# Patient Record
Sex: Female | Born: 1935 | Race: White | Hispanic: No | Marital: Married | Smoking: Never smoker
Health system: Southern US, Community
[De-identification: ages and names within clinical notes are randomized; demographics above are authoritative.]

## PROBLEM LIST (undated history)

## (undated) DIAGNOSIS — K219 Gastro-esophageal reflux disease without esophagitis: Secondary | ICD-10-CM

## (undated) DIAGNOSIS — M1990 Unspecified osteoarthritis, unspecified site: Secondary | ICD-10-CM

## (undated) DIAGNOSIS — H9190 Unspecified hearing loss, unspecified ear: Secondary | ICD-10-CM

## (undated) DIAGNOSIS — I10 Essential (primary) hypertension: Secondary | ICD-10-CM

## (undated) DIAGNOSIS — E785 Hyperlipidemia, unspecified: Secondary | ICD-10-CM

## (undated) DIAGNOSIS — R51 Headache: Secondary | ICD-10-CM

## (undated) DIAGNOSIS — I2510 Atherosclerotic heart disease of native coronary artery without angina pectoris: Secondary | ICD-10-CM

## (undated) HISTORY — DX: Atherosclerotic heart disease of native coronary artery without angina pectoris: I25.10

## (undated) HISTORY — DX: Essential (primary) hypertension: I10

## (undated) HISTORY — DX: Headache: R51

## (undated) HISTORY — DX: Hyperlipidemia, unspecified: E78.5

## (undated) HISTORY — DX: Gastro-esophageal reflux disease without esophagitis: K21.9

## (undated) HISTORY — PX: REPLACEMENT TOTAL KNEE BILATERAL: SUR1225

## (undated) HISTORY — PX: ABDOMINAL HYSTERECTOMY: SHX81

## (undated) HISTORY — PX: CHOLECYSTECTOMY: SHX55

---

## 1999-10-05 ENCOUNTER — Encounter: Admission: RE | Admit: 1999-10-05 | Discharge: 1999-10-05 | Payer: Self-pay | Admitting: Obstetrics and Gynecology

## 1999-10-05 ENCOUNTER — Encounter: Payer: Self-pay | Admitting: Obstetrics and Gynecology

## 2000-10-06 ENCOUNTER — Encounter: Payer: Self-pay | Admitting: Obstetrics and Gynecology

## 2000-10-06 ENCOUNTER — Encounter: Admission: RE | Admit: 2000-10-06 | Discharge: 2000-10-06 | Payer: Self-pay | Admitting: Obstetrics and Gynecology

## 2001-01-19 ENCOUNTER — Inpatient Hospital Stay (HOSPITAL_COMMUNITY): Admission: RE | Admit: 2001-01-19 | Discharge: 2001-01-23 | Payer: Self-pay | Admitting: Orthopaedic Surgery

## 2001-03-11 ENCOUNTER — Encounter: Admission: RE | Admit: 2001-03-11 | Discharge: 2001-04-07 | Payer: Self-pay | Admitting: Orthopaedic Surgery

## 2001-08-05 ENCOUNTER — Inpatient Hospital Stay (HOSPITAL_COMMUNITY): Admission: RE | Admit: 2001-08-05 | Discharge: 2001-08-11 | Payer: Self-pay | Admitting: Orthopaedic Surgery

## 2001-08-11 ENCOUNTER — Encounter: Admission: RE | Admit: 2001-08-11 | Discharge: 2001-08-11 | Payer: Self-pay | Admitting: Family Medicine

## 2001-08-31 ENCOUNTER — Encounter: Admission: RE | Admit: 2001-08-31 | Discharge: 2001-09-28 | Payer: Self-pay | Admitting: Orthopaedic Surgery

## 2002-01-15 ENCOUNTER — Encounter: Admission: RE | Admit: 2002-01-15 | Discharge: 2002-01-15 | Payer: Self-pay | Admitting: Family Medicine

## 2002-01-15 ENCOUNTER — Encounter: Payer: Self-pay | Admitting: Family Medicine

## 2003-01-17 ENCOUNTER — Encounter: Payer: Self-pay | Admitting: Family Medicine

## 2003-01-17 ENCOUNTER — Encounter: Admission: RE | Admit: 2003-01-17 | Discharge: 2003-01-17 | Payer: Self-pay | Admitting: Family Medicine

## 2004-01-24 ENCOUNTER — Ambulatory Visit (HOSPITAL_COMMUNITY): Admission: RE | Admit: 2004-01-24 | Discharge: 2004-01-24 | Payer: Self-pay | Admitting: Family Medicine

## 2004-06-05 ENCOUNTER — Ambulatory Visit: Payer: Self-pay | Admitting: Family Medicine

## 2004-10-16 ENCOUNTER — Ambulatory Visit: Payer: Self-pay | Admitting: Family Medicine

## 2005-02-01 ENCOUNTER — Ambulatory Visit (HOSPITAL_COMMUNITY): Admission: RE | Admit: 2005-02-01 | Discharge: 2005-02-01 | Payer: Self-pay | Admitting: Family Medicine

## 2005-02-19 ENCOUNTER — Ambulatory Visit: Payer: Self-pay | Admitting: Family Medicine

## 2005-04-26 ENCOUNTER — Ambulatory Visit: Payer: Self-pay | Admitting: Family Medicine

## 2005-06-26 ENCOUNTER — Ambulatory Visit: Payer: Self-pay | Admitting: Family Medicine

## 2005-10-03 ENCOUNTER — Ambulatory Visit: Payer: Self-pay | Admitting: Family Medicine

## 2005-10-31 ENCOUNTER — Ambulatory Visit: Payer: Self-pay | Admitting: Family Medicine

## 2006-02-05 ENCOUNTER — Ambulatory Visit (HOSPITAL_COMMUNITY): Admission: RE | Admit: 2006-02-05 | Discharge: 2006-02-05 | Payer: Self-pay | Admitting: Family Medicine

## 2006-03-13 ENCOUNTER — Ambulatory Visit: Payer: Self-pay | Admitting: Family Medicine

## 2006-07-21 ENCOUNTER — Ambulatory Visit: Payer: Self-pay | Admitting: Family Medicine

## 2007-03-11 ENCOUNTER — Ambulatory Visit (HOSPITAL_COMMUNITY): Admission: RE | Admit: 2007-03-11 | Discharge: 2007-03-11 | Payer: Self-pay | Admitting: Family Medicine

## 2008-04-26 ENCOUNTER — Ambulatory Visit (HOSPITAL_COMMUNITY): Admission: RE | Admit: 2008-04-26 | Discharge: 2008-04-26 | Payer: Self-pay | Admitting: Family Medicine

## 2008-05-08 HISTORY — PX: CORONARY ARTERY BYPASS GRAFT: SHX141

## 2008-05-10 ENCOUNTER — Emergency Department (HOSPITAL_COMMUNITY): Admission: EM | Admit: 2008-05-10 | Discharge: 2008-05-10 | Payer: Self-pay | Admitting: Emergency Medicine

## 2008-05-11 HISTORY — PX: CARDIOVASCULAR STRESS TEST: SHX262

## 2008-05-12 HISTORY — PX: US ECHOCARDIOGRAPHY: HXRAD669

## 2008-05-16 ENCOUNTER — Inpatient Hospital Stay (HOSPITAL_BASED_OUTPATIENT_CLINIC_OR_DEPARTMENT_OTHER): Admission: RE | Admit: 2008-05-16 | Discharge: 2008-05-16 | Payer: Self-pay | Admitting: Cardiology

## 2008-05-16 HISTORY — PX: CARDIAC CATHETERIZATION: SHX172

## 2008-05-17 ENCOUNTER — Ambulatory Visit: Payer: Self-pay | Admitting: Cardiothoracic Surgery

## 2008-05-18 ENCOUNTER — Encounter: Payer: Self-pay | Admitting: Cardiothoracic Surgery

## 2008-05-18 ENCOUNTER — Ambulatory Visit (HOSPITAL_COMMUNITY): Admission: RE | Admit: 2008-05-18 | Discharge: 2008-05-18 | Payer: Self-pay | Admitting: Cardiothoracic Surgery

## 2008-05-20 ENCOUNTER — Inpatient Hospital Stay (HOSPITAL_COMMUNITY): Admission: RE | Admit: 2008-05-20 | Discharge: 2008-05-25 | Payer: Self-pay | Admitting: Cardiothoracic Surgery

## 2008-05-20 ENCOUNTER — Ambulatory Visit: Payer: Self-pay | Admitting: Cardiothoracic Surgery

## 2008-06-23 ENCOUNTER — Ambulatory Visit: Payer: Self-pay | Admitting: Cardiothoracic Surgery

## 2010-04-26 ENCOUNTER — Ambulatory Visit: Payer: Self-pay | Admitting: Cardiology

## 2010-07-29 ENCOUNTER — Encounter: Payer: Self-pay | Admitting: Cardiothoracic Surgery

## 2010-09-24 ENCOUNTER — Other Ambulatory Visit (HOSPITAL_COMMUNITY): Payer: Self-pay | Admitting: Family Medicine

## 2010-09-24 DIAGNOSIS — Z139 Encounter for screening, unspecified: Secondary | ICD-10-CM

## 2010-10-25 ENCOUNTER — Ambulatory Visit (HOSPITAL_COMMUNITY): Payer: Self-pay

## 2010-10-30 ENCOUNTER — Ambulatory Visit (HOSPITAL_COMMUNITY)
Admission: RE | Admit: 2010-10-30 | Discharge: 2010-10-30 | Disposition: A | Discharge status: Home / Self Care | Payer: Medicare Other | Source: Ambulatory Visit | Attending: Family Medicine | Admitting: Family Medicine

## 2010-10-30 DIAGNOSIS — Z1231 Encounter for screening mammogram for malignant neoplasm of breast: Secondary | ICD-10-CM | POA: Insufficient documentation

## 2010-10-30 DIAGNOSIS — Z139 Encounter for screening, unspecified: Secondary | ICD-10-CM

## 2010-11-20 NOTE — Discharge Summary (Signed)
NAMESHAWNTELL, DIXSON NO.:  [id number]   MEDICAL RECORD NO.:  [id number]          PATIENT TYPE:  INP   LOCATION:  2016                         FACILITY:  [facility]   PHYSICIAN:  Sheliah Plane, MD    DATE OF BIRTH:  Dec 05, 1923   DATE OF ADMISSION:  05/20/2008  DATE OF DISCHARGE:  05/25/2008                               DISCHARGE SUMMARY   HISTORY:  The patient is an 77 year old female who is noted over the  past couple of months several episodes of heart racing associated with  substernal chest discomfort with some radiation and aching into her left  arm.  Due to the progressive nature of her symptoms the patient was  referred to Dr. Roger Shelter.  A Cardiolite stress test was done and  this revealed preserved left ventricular function with evidence of  anterior ischemia.  An echocardiogram showed moderate left ventricular  hypertrophy with impaired left ventricular relaxation.  Normal systolic  function, mild aortic sclerosis, mild mitral regurgitation, and mild  tricuspid regurgitation.  Due to these findings, a cardiac  catheterization was done and this revealed severe 3-vessel coronary  artery disease.  Due to this, she was referred to Dr. Sheliah Plane  for consideration of surgical revascularization.  Dr. Tyrone Sage evaluated  the patient and her studies and agreed with these recommendations and  she was admitted this hospitalization for the procedure.   PAST MEDICAL HISTORY:  1. Hypertension.  2. Hyperlipidemia.   PAST SURGICAL HISTORY:  Bilateral knee replacement, cholecystectomy, and  hysterectomy.   MEDICATIONS PRIOR TO ADMISSION:  1. Zocor 20 mg daily.  2. Aspirin 81 mg daily.  3. Avalide 150/12.5 mg once daily.  4. Fish oil 1 daily.  5. Toprol 25 mg daily.   FAMILY HISTORY:  Please see the history and physical done at the time of  admission.   SOCIAL HISTORY:  Please see the history and physical done at the time of  admission.   REVIEW  OF SYMPTOMS:  Please see the history and physical done at the  time of admission.   PHYSICAL EXAMINATION:  Please see the history and physical done at the  time of admission.   HOSPITAL COURSE:  The patient was admitted electively and on May 20, 2008, she was taken to the operating room where she underwent the  following procedure, coronary artery bypass grafting x4.  The following  grafts were placed:  1. Left internal mammary artery to the LAD.  2. A saphenous vein graft to the obtuse marginal.  3. A saphenous vein graft to the right coronary artery.  4. A saphenous vein graft to the diagonal.   The patient tolerated the procedure well and was taken to the surgical  intensive care unit in stable condition.   POSTOPERATIVE HOSPITAL COURSE:  The patient has done quite well.  She  was extubated without difficulty.  She is neurologically intact.  She  remained hemodynamically stable initially requiring some minor inotropic  support; however, these was weaned without difficulty.  She does have a  acute blood loss anemia, which has stabilized.  Her most  recent  hemoglobin and hematocrit dated on May 25, 2008, are 9.6 and 28.2  respectively.  Electrolytes, BUN, and creatinine are within normal  limits.  Her incisions are healing without evidence of infection;  however, she does have some blistering in the right knee endoscopic vein  harvest site from Steri-Strips.  These are being treated with wound care  including Silvadene.  She did have a postoperative episode of atrial  fibrillation and several short bursts of supraventricular tachycardia.  These have been stabilized with beta-blockade.  Oxygen has been weaned  and she maintains good saturations on room air.  She has tolerated a  routine advancement activity using standard protocols.  Overall her  status is felt to be quite stable for discharge on today's date May 25, 2008.   MEDICATIONS ON DISCHARGE:  1. Zocor 20  mg daily.  2. Aspirin 81 mg daily.  3. Avalide 150/12.5 mg once daily.  4. Fish oil once daily.  5. Toprol-XL 50 mg once daily.  6. For pain Ultram 50 mg 1 every 6 hours p.r.n.   ADDITIONAL MEDICATIONS:  Silvadene to the right knee blister 3 times  daily.   INSTRUCTIONS:  The patient will receive written instructions in regard  to medications, activity, diet, wound care, and followup.   FOLLOWUP:  Dr. Tyrone Sage on June 23, 2008, at 12:30.  Additional, she  will see Conni Elliot, nurse practitioner, in 2 weeks at Dr. Ronnald Nian  office.   FINAL DIAGNOSIS:  Severe 3-vessel coronary artery disease now status  post surgical revascularization as described.   OTHER DIAGNOSES:  1. Hypertension.  2. Hypercholesterolemia.  3. Acute blood loss anemia.  4. Postoperative supraventricular tachycardia.  5. Brief episode of atrial fibrillation now, normal sinus rhythm.  6. Postoperative thrombocytopenia resolved.      Rowe Clack, P.A.-C.      Sheliah Plane, MD  Electronically Signed    WEG/MEDQ  D:  05/25/2008  T:  05/25/2008  Job:  [id number]   cc:   Sheliah Plane, MD  Delaney Meigs, M.D.  Colleen Can. Deborah Chalk, M.D.

## 2010-11-20 NOTE — Assessment & Plan Note (Signed)
OFFICE VISIT   Laura Woodard, Laura Woodard  DOB:  08/01/1923                                        June 23, 2008  CHART #:  [id number]   The patient returns to the office today in followup after coronary  artery bypass grafting for unstable angina on 05/20/2008.  At that time,  she had coronary artery bypass grafting x4 with the left internal  mammary to the LAD, vein graft to the diagonal, vein graft to the first  obtuse marginal, and vein graft to the distal right coronary artery with  right thigh and calf endovein harvesting.  Overall, the patient has done  extremely well especially considering her age of 67 years.  She is  running her own cardiac rehab program at home.  She was not interested  in a formal rehab, though appears to be returning rapidly to her usual  activities without recurrent angina.  She recently stopped working and  decided she would not return to working part-time job.  She did note  early after discharge home some lightheadedness, and her Avalide was  stopped for low blood pressure.   PHYSICAL EXAMINATION:  VITAL SIGNS:  Her blood pressure 170/86, pulse is  60, respiratory rate 18, and O2 sats 99%.  CHEST:  Her sternum is stable and well healed.  LUNGS:  Clear bilaterally.  CARDIAC:  Regular rate and rhythm without murmur or gallop.  The vein  harvest site is healing well.  EXTREMITIES:  She has no pedal edema.   Followup chest x-ray done at Dr. Ronnald Nian office last week shows clear  lung fields without evidence of effusions bilaterally.   She continues on Toprol 25 b.i.d., Zocor 20 daily, and aspirin 81 mg a  day; currently holding her Avalide 150/12.5.   Overall, I am very pleased with her progress.  She seems very anxious to  return to further activities.  I have allowed her return to driving and  cautioned about any heavy lifting.  I have not made a return appointment  to see me but would be glad to see her at anytime,  should it be  necessary.   Sheliah Plane, MD  Electronically Signed   EG/MEDQ  D:  06/23/2008  T:  06/23/2008  Job:  [id number]   cc:   Colleen Can. Deborah Chalk, M.D.  Delaney Meigs, M.D.

## 2010-11-20 NOTE — H&P (Signed)
NAMELYRIC, Laura NO.:  [id number]   MEDICAL RECORD NO.:  [id number]          PATIENT TYPE:  OIB   LOCATION:  NA                           FACILITY:  [facility]   PHYSICIAN:  Colleen Can. Deborah Chalk, M.D.DATE OF BIRTH:  [birth year]   DATE OF ADMISSION:  DATE OF DISCHARGE:                              HISTORY & PHYSICAL   CHIEF COMPLAINT:  Chest pain.   HISTORY OF PRESENT ILLNESS:  The patient is an 77 year old white female  who is referred for diagnostic cardiac catheterization.  She has had  spells of heart racing here over the last couple of months.  She has  also noted some discomfort in her left arm as well as hurting across the  shoulders.  She had an episode this Sunday 1 week ago while washing  dishes. She had significant discomfort across her shoulders and then  developed a rapid heart rate.  She was referred for Cardiolite study  which was performed on November 4 that showed anterior ischemia.  Her  ejection fraction was normal at 61%.  She is now referred for diagnostic  cardiac catheterization.   PAST MEDICAL HISTORY:  1. Bilateral knee replacements  2. Remote cholecystectomy.  3. Remote hysterectomy.  4. Hyperlipidemia.  5. Hypertension.  6. Positive family history of coronary disease.   ALLERGIES:  None.   CURRENT MEDICATIONS:  Zocor 20 mg a day, baby aspirin a day. Avalide,  that dose is currently unknown. Fish oil daily, multivitamin daily,  joint pain medicine daily and Toprol XL 25 mg a day.   FAMILY HISTORY:  Father died in his 85s with arthritis and had a heart  attack in his 15s as well as hypertension.  He was subsequently killed  in a motor vehicle accident.  Her mother died at age 64 of lung cancer  and had hypertension.  She has two brothers and five sisters. A brother  and a sister have had stents placed as well as previous heart attack.   SOCIAL HISTORY:  She is married. Her husband has progressive dementia.  She has never smoked and  does not use alcohol.  She has two children.   REVIEW OF SYSTEMS:  As noted above and is otherwise unremarkable.   PHYSICAL EXAMINATION:  GENERAL APPEARANCE: She is a pleasant white  female.  She is in no acute distress.  VITAL SIGNS:  Her weight 125-1/2 pounds, blood pressure 170/80 sitting,  162/80 standing. Heart rate 80, respirations 18.  She is afebrile.  SKIN:  Warm and dry.  Color is unremarkable.  LUNGS:  Clear.  HEART: Shows a regular rhythm.  ABDOMEN: Soft with positive bowel sounds, nontender.  EXTREMITIES: Without edema.  NEUROLOGIC:  Shows no gross focal deficits.   Pertinent labs are pending.   OVERALL IMPRESSION:  1. Chest pain.  2. Abnormal Cardiolite study.  3. Hypertension.  4. History of palpitations with rapid heart rate  5. Positive family history of coronary disease.   PLAN:  We will proceed on with diagnostic cardiac catheterization.  The  risks of the procedure and benefits have all been explained and she  is  willing to proceed on Monday, May 16, 2008.      Sharlee Blew, N.P.      Colleen Can. Deborah Chalk, M.D.  Electronically Signed    LC/MEDQ  D:  05/16/2008  T:  05/16/2008  Job:  [id number]

## 2010-11-20 NOTE — Cardiovascular Report (Signed)
NAMENYIAH, PIANKA NO.:  [id number]   MEDICAL RECORD NO.:  [id number]          PATIENT TYPE:  OIB   LOCATION:  1965                         FACILITY:  [facility]   PHYSICIAN:  Colleen Can. Deborah Chalk, M.D.DATE OF BIRTH:  [birth year]   DATE OF PROCEDURE:  05/16/2008  DATE OF DISCHARGE:  05/16/2008                            CARDIAC CATHETERIZATION   Ms. Bellavance presents with recurrent chest pain.  She has a history of  tachycardia.  She had a stress Cardiolite study, which showed anterior  ischemia.  She had an ejection fraction of 61% at that time.   PROCEDURE:  Left heart catheterization with selective coronary  angiography and left ventricular angiography.   TYPE AND SITE OF ENTRY:  Percutaneous right femoral artery.   CATHETERS:  4-French 4 curved Judkins left coronary catheter, 4-French 3-  DRC right coronary catheter, 4-French pigtail ventriculographic  catheter.   CONTRAST MATERIAL:  Omnipaque.   MEDICATIONS GIVEN PRIOR TO PROCEDURE:  Valium 10 mg p.o.   MEDICATIONS GIVEN DURING THE PROCEDURE:  Nitroglycerin sublingual and  labetalol 10 mg IV.   COMMENT:  The patient tolerated the procedure well.   HEMODYNAMIC DATA:  The aortic pressure was 149/67, LV was 162/11-17.  There is no aortic valve gradient noted on pullback.   ANGIOGRAPHIC DATA:  1. Left main coronary artery is normal.  2. Left anterior descending:  The left anterior descending has      approximately 1-cm of normal vessel and then has severe segmental      disease for the next 3-4 cm.  Rising out of this area severe      stenosis, it is a reasonably large first diagonal vessel and the      next smaller second diagonal vessel.  These vessels arise      approximately 1 cm apart from on the left anterior descending.      There is a calcified severe disease involving the ostium of these      diagonals and would estimated to be at least a 95% stenosis at its      highest point.  The second  diagonal vessel is approximately 1 mm in      diameter and first diagonal was proximally 1.5-2 mm in diameter.      It is felt that the left anterior descending at the first diagonal      would be bypassable.  There is satisfactory flow in the distal left      anterior descending and 1 small irregular plaque of approximately      50% focally and distally.  3. Left circumflex left.  The left circumflex is a moderate large      vessel and gives off a posterior lateral branch.  It has a large      obtuse marginal and extends to the apex.  It had irregularities,      but no significant focal stenosis.  4. Right coronary artery.  The right coronary artery is a moderate-      sized dominant vessel.  It has sequential stenosis, proximally      involving ostium  of the vessel.  There were a 4 separate stenoses      each in the 80-90% narrowed range.  Distal vessel would be      bypassable.  5. Left ventricular angiogram was performed in the RAO position.      Overall cardiac size and silhouette are normal.  The global      ejection fraction is estimated at 70+ percent range.  There is no      mitral regurgitation, intracardiac calcification, or intracavitary      filling defect.   OVERALL IMPRESSION:  1. Normal left ventricular function.  2. Severe two-vessel coronary atherosclerosis involving bifurcations      as well as diagonal branches of the left anterior descending.   DISCUSSION:  In light of these findings, it is felt that Ms. Scholle  will need to be referred for coronary artery bypass grafting.      Colleen Can. Deborah Chalk, M.D.  Electronically Signed     SNT/MEDQ  D:  05/16/2008  T:  05/17/2008  Job:  [id number]   cc:   Delaney Meigs, M.D.

## 2010-11-20 NOTE — H&P (Signed)
NAMEMCKENZEY, Laura Woodard NO.:  [id number]   MEDICAL RECORD NO.:  [id number]          PATIENT TYPE:  OIB   LOCATION:  1965                         FACILITY:  [facility]   PHYSICIAN:  Sheliah Plane, MD    DATE OF BIRTH:  1923/07/18   DATE OF ADMISSION:  05/17/2008  DATE OF DISCHARGE:                              HISTORY & PHYSICAL   FOLLOWUP CARDIOLOGIST:  Colleen Can. Deborah Chalk, M.D.   PRIMARY CARE PHYSICIAN:  Delaney Meigs, M.D.   REASON FOR CONSULTATION:  Severe three-vessel coronary artery disease.   HISTORY OF PRESENT ILLNESS:  The patient is an 77 year old female who is  noted over the past couple of months several episodes of heart racing  associated with substernal discomfort with some aching radiating into  her left arm.  Because of the progressive nature of the symptoms, the  patient was referred to Dr. Deborah Chalk.  Cardiolite stress test was done  showing preserved LV function with evidence of anterior ischemia.  An  echocardiogram showed moderate LVH, impaired LV relaxation, normal  systolic function, mild aortic sclerosis, mild mitral regurgitation, and  mild tricuspid regurgitation.  Because of the positive stress test and  the progressive symptoms of angina, the patient underwent cardiac  catheterization, demonstrating severe three-vessel coronary artery  disease and she was referred for discussion of risks and options with  coronary artery bypass grafting with treatment recommended by  Cardiology.   PAST MEDICAL HISTORY:  The patient denies any previous myocardial  infarction, angioplasty, or cardiac surgery.  She has a history of  hypertensive hyperlipidemia.  Denies diabetes.  She is a nonsmoker.  She  does have the family history of coronary disease.  She has had no  previous stroke, no claudication, and no renal insufficiency.   Past medical problems include bilateral knee replacements,  cholecystectomy, and hysterectomy.   SOCIAL AND FAMILY  HISTORY:  The patient just retired from working in  [employer] job about 2 weeks ago.  Her husband has known dementia and she  spends a significant amount of time cared for him.  She has remained  very physically active.  No alcohol use.  Family history is positive  coronary disease.  The patient's father had a myocardial infarction at  age 21, died of a motor vehicle accident 7 or 8 years later.  Mother had  no heart trouble, died of lung cancer.  She has 2 sons, both who have  known coronary disease.  One had a myocardial infarction at age 11, and  subsequently had a AICD placed, other son has cardiac stents.   CURRENT MEDICATIONS:  1. Toprol 25 mg b.i.d.  2. Zocor 20 mg a day.  3. Aspirin 81 mg a day.  4. Avalide 150/12.5.  5. Fish oil.  I have asked her to discontinue her fish oil prior to      surgery.   ALLERGIES:  None known.   REVIEW OF SYSTEMS:  Positive for chest pain, palpitations, and  exertional shortness of breath.  She has had several months ago an  episode of blacking out in the bathroom at night,  but no other episodes.  Denies weight gain or loss, fever or chills.  Denies amaurosis or TIAs.  Denies wheezing or hemoptysis.  She has had some mild exertional  shortness of breath.  Denies change in bowel habits.  Denies any blood  in her urine or stool.  MUSCULOSKELETAL:  With the bilateral knee  replacements, she has been ambulatory without difficulty.  Denies  polyuria or polydipsia.  Other review of systems are negative.   PHYSICAL EXAMINATION:  VITAL SIGNS:  Her blood pressure is 198/89, pulse  is 65, respiratory rate is 18, and O2 sats 98%.  GENERAL:  The patient is awake, alert, and neurologically intact.  She  appears younger than her stated age and able to provide her history in  good detail.  NECK:  She has no carotid bruits.  LUNGS:  Clear bilaterally.  CARDIAC:  Regular rhythm without murmur or gallop.  ABDOMINAL:  Benign without palpable masses.  Right groin  site and cath  sites without significant hematoma.  EXTREMITIES:  She does have palpable distal pulses.  Has superficial  varicosities in both legs, spiral vein type.  Appears to have adequate  vein at both ankles, +1 DP and PT pulses bilaterally.   Cardiac catheterization films were reviewed.  The patient has a greater  90% complex of the calcified proximal LAD lesion involving diagonal.  Additionally, she has significant circumflex disease with several small  obtuse marginal vessels.  The right coronary artery also is a codominant  vessel with sequential 90% lesions.  Overall, ventricular function  preserved.   IMPRESSION:  Active 77 year old female with positive stress test and  symptoms of progressive angina with severe three-vessel coronary artery  disease without any superior comorbidities.  I agree with Dr. Deborah Chalk  recommendation of coronary artery bypass grafting.  I have discussed  with the patient and her family including her 2 sons and sisters in  detail, the risks and options involved.  The risks of death, infection,  stroke, myocardial infarction, bleeding, and blood transfusion, all  discussed in detail and the patient has had her questions answered and  is willing to proceed with surgery.   PLAN:  To proceed on May 20, 2008, obtaining venous mapping and  carotid Doppler studies, and preoperative lab on the May 18, 2008.      Sheliah Plane, MD  Electronically Signed     EG/MEDQ  D:  05/17/2008  T:  05/18/2008  Job:  [id number]   cc:   Delaney Meigs, M.D.  Colleen Can. Deborah Chalk, M.D.

## 2010-11-20 NOTE — Op Note (Signed)
NAMEANTONINA, Woodard NO.:  [id number]   MEDICAL RECORD NO.:  [id number]          PATIENT TYPE:  INP   LOCATION:  2016                         FACILITY:  [facility]   PHYSICIAN:  Sheliah Plane, MD    DATE OF BIRTH:  1924-05-10   DATE OF PROCEDURE:  05/20/2008  DATE OF DISCHARGE:                               OPERATIVE REPORT   PREOPERATIVE DIAGNOSIS:  Unstable angina with severe coronary occlusive  disease.   POSTOPERATIVE DIAGNOSIS:  Unstable angina with severe coronary occlusive  disease.   SURGICAL PROCEDURES:  1. Coronary artery bypass grafting x4 with left internal mammary to      the left anterior descending coronary artery.  2. Reverse saphenous vein graft to the diagonal coronary artery.  3. Reverse saphenous vein graft to the first obtuse marginal.  4. Reverse saphenous vein graft to the distal right coronary artery.  5. Right thigh and calf endovein harvesting.   SURGEON:  Sheliah Plane, MD.   FIRST ASSISTANT:  Rowe Clack, P.A.-C.   BRIEF HISTORY:  The patient is an 77 year old female who began having  increasing symptoms of exertional symptoms and because of her increasing  chest discomfort and shortness of breath, she saw Dr. Deborah Woodard who did a  stress test which was positive for anterior ischemia.  Cardiac  catheterization showed complex proximal LAD disease greater than 90%  involving diagonal.  There was 60% obstruction on the circumflex  involving a larger first obtuse marginal and smaller distal circ  branches.  The right coronary artery was of moderate size with  sequential 80-90% lesions in the proximal half the vessel.  Overall  ventricular function was preserved because of the patient's critical  anatomy, not suitable for angioplasty.  Coronary artery bypass grafting  was recommended.  The patient agreed and signed informed consent.   DESCRIPTION OF PROCEDURE:  With Swan-Ganz and arterial line monitors in  place, the patient  underwent general endotracheal anesthesia without  incidence given the chest and legs was prepped with Betadine and draped  in the usual sterile manner.  Using the [employer] endovein harvesting  system, vein was harvested from the right calf.  A median sternotomy was  performed.  Left internal mammary artery was dissected down as pedicle  graft.  This artery was divided and had good free flow.  Her pericardium  was opened.  Overall ventricular function appeared preserved.  The  patient did have significant calcification at the takeoff of the  innominate artery.  Cannulation was accomplished in area just proximal  to this without difficulty.  The right atrial cannula was placed.  The  patient had been systemically heparinized prior to an initiation of  endovein harvesting, 2500 units of the heparin had been given.  The  patient was placed on cardiopulmonary bypass at 2.4 liters per minute  per meter squared.  Sites of anastomosis were selected and dissected out  of the epicardium.  The patient's body temperature was cooled to 30  degrees.  The aortic crossclamp was applied and 500 mL of cold blood  potassium cardioplegia was administered with rapid diastolic arrest  of  the heart.  Myocardial septal temperatures monitored throughout  crossclamp.  Heart was elevated and the first obtuse marginal coronary  artery was identified and opened, and admitted a 1-1/2-mm probe.  Using  a running 7-0 Prolene, distal anastomosis was performed.  Attention was  then turned to the distal right coronary artery.  The vessel was opened  and admitted a 1-1/2 mm probe.  Using a running 7-0 Prolene, distal  anastomosis was performed with reverse saphenous vein graft.  Attention  was then turned to the diagonal coronary artery, which was smaller  vessel, but 1.2 to 1.3 mm in size.  Using a running 7-0 Prolene, distal  anastomosis was performed.  Attention was then turned to distal left  anterior descending coronary  artery.  The proximal 2/3 of the vessel was  myocardial, where the vessel was obvious in the distal third.  The LAD  was opened and admitted a 1-1/2 mm probe proximally distally.  Using a  running 8-0 Prolene, the left internal mammary artery was anastomosed to  the left anterior descending coronary artery with the crossclamp still  in place.  The 3-punch aortotomies were performed.  Each with 3 vein  grafts anastomosed to the ascending aorta.  Air was evacuated from the  grafts in the cross occlusion clamp and the aortic crossclamp was  removed with a total crossclamp time of 82 minutes.  The patient  spontaneously converted to a sinus rhythm.  Sites of anastomosis were  inspected and were free of bleeding.  The patient was then ventilated  and weaned from cardiopulmonary bypass without difficulty.  She remained  hemodynamically stable.  She was decannulated in the usual fashion.  Protamine sulfate was administered within operative field.  Hemostatic  two atrial, two ventricular pacing wires were applied.  Graft markers  applied.  Left pleural tube and mediastinal drain were left in place.  Sternum closed with #6 stainless steel wire.  Fascia closed with  interrupted 0 Vicryl, running 3-0 Vicryl, subcutaneous tissue, and 4-0  subcuticular stitch in skin edges.  Dry dressings were applied.  Sponge  and needle count was reported as correct at the completion of procedure.  The patient tolerated the procedure without obvious complication and was  transferred to surgical intensive care unit for further postoperative  care.  Total pump time was 105 minutes.  The patient did require blood  via our blood bank because of low hematocrit while on bypass.      Sheliah Plane, MD  Electronically Signed     EG/MEDQ  D:  05/24/2008  T:  05/25/2008  Job:  [id number]   cc:   Colleen Can. Laura Woodard, M.D.

## 2010-12-27 ENCOUNTER — Telehealth: Payer: Self-pay | Admitting: Cardiology

## 2010-12-27 NOTE — Telephone Encounter (Signed)
Called wanting to schedule her next appointment with her cardiologist. Please call back with Dr. Ronnald Nian referral. I could not find the file up front.

## 2010-12-28 NOTE — Telephone Encounter (Signed)
RN set pt up to follow up with Norma Fredrickson NP on 04/22/11 at 0900.  Pt notified.

## 2011-04-09 LAB — POCT I-STAT 3, ART BLOOD GAS (G3+)
Acid-Base Excess: 1
Acid-Base Excess: 2
Acid-base deficit: 2
Acid-base deficit: 2
Bicarbonate: 21.1
Bicarbonate: 22.2
Bicarbonate: 24.4 — ABNORMAL HIGH
Bicarbonate: 25.7 — ABNORMAL HIGH
Bicarbonate: 28.5 — ABNORMAL HIGH
O2 Saturation: 100
O2 Saturation: 100
O2 Saturation: 100
O2 Saturation: 96
O2 Saturation: 99
Patient temperature: 34.7
Patient temperature: 98.8
TCO2: 22
TCO2: 23
TCO2: 25
TCO2: 27
TCO2: 30
pCO2 arterial: 29 — ABNORMAL LOW
pCO2 arterial: 30.6 — ABNORMAL LOW
pCO2 arterial: 36
pCO2 arterial: 39.4
pCO2 arterial: 56.8 — ABNORMAL HIGH
pH, Arterial: 7.309 — ABNORMAL LOW
pH, Arterial: 7.422 — ABNORMAL HIGH
pH, Arterial: 7.439 — ABNORMAL HIGH
pH, Arterial: 7.447 — ABNORMAL HIGH
pH, Arterial: 7.482 — ABNORMAL HIGH
pO2, Arterial: 325 — ABNORMAL HIGH
pO2, Arterial: 340 — ABNORMAL HIGH
pO2, Arterial: 596 — ABNORMAL HIGH
pO2, Arterial: 76 — ABNORMAL LOW
pO2, Arterial: 99

## 2011-04-09 LAB — GLUCOSE, CAPILLARY
Glucose-Capillary: 100 — ABNORMAL HIGH
Glucose-Capillary: 101 — ABNORMAL HIGH
Glucose-Capillary: 101 — ABNORMAL HIGH
Glucose-Capillary: 102 — ABNORMAL HIGH
Glucose-Capillary: 103 — ABNORMAL HIGH
Glucose-Capillary: 106 — ABNORMAL HIGH
Glucose-Capillary: 107 — ABNORMAL HIGH
Glucose-Capillary: 109 — ABNORMAL HIGH
Glucose-Capillary: 110 — ABNORMAL HIGH
Glucose-Capillary: 111 — ABNORMAL HIGH
Glucose-Capillary: 124 — ABNORMAL HIGH
Glucose-Capillary: 127 — ABNORMAL HIGH
Glucose-Capillary: 140 — ABNORMAL HIGH
Glucose-Capillary: 142 — ABNORMAL HIGH
Glucose-Capillary: 142 — ABNORMAL HIGH
Glucose-Capillary: 78
Glucose-Capillary: 85
Glucose-Capillary: 85
Glucose-Capillary: 88
Glucose-Capillary: 92
Glucose-Capillary: 92
Glucose-Capillary: 93
Glucose-Capillary: 97
Glucose-Capillary: 98

## 2011-04-09 LAB — TYPE AND SCREEN
ABO/RH(D): O POS
Antibody Screen: NEGATIVE

## 2011-04-09 LAB — CBC
HCT: 24.8 — ABNORMAL LOW
HCT: 25.3 — ABNORMAL LOW
HCT: 26.2 — ABNORMAL LOW
HCT: 27.9 — ABNORMAL LOW
HCT: 28.2 — ABNORMAL LOW
HCT: 28.4 — ABNORMAL LOW
HCT: 34.4 — ABNORMAL LOW
HCT: 36.7
Hemoglobin: 11.8 — ABNORMAL LOW
Hemoglobin: 12.1
Hemoglobin: 8.1 — ABNORMAL LOW
Hemoglobin: 8.8 — ABNORMAL LOW
Hemoglobin: 9 — ABNORMAL LOW
Hemoglobin: 9.4 — ABNORMAL LOW
Hemoglobin: 9.6 — ABNORMAL LOW
Hemoglobin: 9.8 — ABNORMAL LOW
MCHC: 32.8
MCHC: 32.9
MCHC: 33.1
MCHC: 33.6
MCHC: 33.7
MCHC: 34.2
MCHC: 34.2
MCHC: 34.4
MCHC: 34.9
MCV: 87.9
MCV: 90.1
MCV: 90.2
MCV: 91.3
MCV: 92.1
MCV: 92.8
MCV: 93.1
Platelets: 122 — ABNORMAL LOW
Platelets: 158
Platelets: 178
Platelets: 184
Platelets: 48 — CL
Platelets: 49 — CL
Platelets: 51 — ABNORMAL LOW
Platelets: 53 — ABNORMAL LOW
Platelets: 67 — ABNORMAL LOW
Platelets: 73 — ABNORMAL LOW
RBC: 2.69 — ABNORMAL LOW
RBC: 2.87 — ABNORMAL LOW
RBC: 2.88 — ABNORMAL LOW
RBC: 3 — ABNORMAL LOW
RBC: 3.05 — ABNORMAL LOW
RBC: 3.82 — ABNORMAL LOW
RBC: 4.07
RDW: 13.8
RDW: 14.4
RDW: 14.4
RDW: 14.5
RDW: 14.6
RDW: 14.8
RDW: 14.9
RDW: 14.9
WBC: 11.2 — ABNORMAL HIGH
WBC: 6.9
WBC: 7.6
WBC: 7.7
WBC: 7.8
WBC: 8.1
WBC: 8.9
WBC: 9.9

## 2011-04-09 LAB — PROTIME-INR
INR: 1
INR: 1.9 — ABNORMAL HIGH
Prothrombin Time: 13.2
Prothrombin Time: 23 — ABNORMAL HIGH

## 2011-04-09 LAB — APTT
aPTT: 34
aPTT: 48 — ABNORMAL HIGH

## 2011-04-09 LAB — CK TOTAL AND CKMB (NOT AT ARMC)
CK, MB: 13 — ABNORMAL HIGH
CK, MB: 16.5 — ABNORMAL HIGH
Relative Index: 5.7 — ABNORMAL HIGH
Relative Index: 8.8 — ABNORMAL HIGH
Total CK: 187 — ABNORMAL HIGH
Total CK: 230 — ABNORMAL HIGH

## 2011-04-09 LAB — DIFFERENTIAL
Basophils Relative: 0
Lymphs Abs: 1.3
Monocytes Absolute: 0.5
Monocytes Relative: 7
Neutrophils Relative %: 72

## 2011-04-09 LAB — URINALYSIS, ROUTINE W REFLEX MICROSCOPIC
Bilirubin Urine: NEGATIVE
Glucose, UA: NEGATIVE
Hgb urine dipstick: NEGATIVE
Ketones, ur: NEGATIVE
Nitrite: NEGATIVE
Protein, ur: NEGATIVE
Specific Gravity, Urine: 1.013
Urobilinogen, UA: 1
pH: 6.5

## 2011-04-09 LAB — BLOOD GAS, ARTERIAL
Acid-Base Excess: 1.6
Bicarbonate: 25.6 — ABNORMAL HIGH
Drawn by: 206361
FIO2: 0.21
O2 Saturation: 96.1
Patient temperature: 98.6
TCO2: 26.8
pCO2 arterial: 40.2
pH, Arterial: 7.42 — ABNORMAL HIGH
pO2, Arterial: 77.9 — ABNORMAL LOW

## 2011-04-09 LAB — POCT I-STAT 4, (NA,K, GLUC, HGB,HCT)
Glucose, Bld: 117 — ABNORMAL HIGH
Glucose, Bld: 117 — ABNORMAL HIGH
Glucose, Bld: 118 — ABNORMAL HIGH
Glucose, Bld: 71
Glucose, Bld: 83
Glucose, Bld: 88
Glucose, Bld: 99
HCT: 17 — ABNORMAL LOW
HCT: 21 — ABNORMAL LOW
HCT: 23 — ABNORMAL LOW
HCT: 25 — ABNORMAL LOW
HCT: 26 — ABNORMAL LOW
HCT: 31 — ABNORMAL LOW
HCT: 33 — ABNORMAL LOW
Hemoglobin: 10.5 — ABNORMAL LOW
Hemoglobin: 11.2 — ABNORMAL LOW
Hemoglobin: 5.8 — CL
Hemoglobin: 7.1 — CL
Hemoglobin: 7.8 — CL
Hemoglobin: 8.5 — ABNORMAL LOW
Hemoglobin: 8.8 — ABNORMAL LOW
Potassium: 3.1 — ABNORMAL LOW
Potassium: 3.3 — ABNORMAL LOW
Potassium: 3.4 — ABNORMAL LOW
Potassium: 3.7
Potassium: 3.7
Potassium: 4.6
Potassium: 4.7
Sodium: 131 — ABNORMAL LOW
Sodium: 131 — ABNORMAL LOW
Sodium: 135
Sodium: 136
Sodium: 137
Sodium: 139
Sodium: 153 — ABNORMAL HIGH

## 2011-04-09 LAB — BASIC METABOLIC PANEL
BUN: 12
BUN: 18
CO2: 26
CO2: 28
CO2: 28
Calcium: 7.6 — ABNORMAL LOW
Calcium: 8.8
Calcium: 8.8
Calcium: 9.9
Chloride: 101
Chloride: 108
Creatinine, Ser: 0.72
Creatinine, Ser: 0.9
Creatinine, Ser: 0.91
GFR calc Af Amer: >60
GFR calc Af Amer: >60
GFR calc Af Amer: >60
GFR calc non Af Amer: 59 — ABNORMAL LOW
GFR calc non Af Amer: >60
GFR calc non Af Amer: >60
GFR calc non Af Amer: >60
Glucose, Bld: 102 — ABNORMAL HIGH
Glucose, Bld: 107 — ABNORMAL HIGH
Glucose, Bld: 113 — ABNORMAL HIGH
Glucose, Bld: 113 — ABNORMAL HIGH
Potassium: 3.5
Potassium: 3.6
Potassium: 3.7
Potassium: 3.8
Sodium: 136
Sodium: 137
Sodium: 138

## 2011-04-09 LAB — HEMOGLOBIN A1C
Hgb A1c MFr Bld: 6.2 — ABNORMAL HIGH
Mean Plasma Glucose: 131

## 2011-04-09 LAB — MAGNESIUM
Magnesium: 2.1
Magnesium: 2.1
Magnesium: 2.3

## 2011-04-09 LAB — PLATELET COUNT: Platelets: 90 — ABNORMAL LOW

## 2011-04-09 LAB — URINE MICROSCOPIC-ADD ON

## 2011-04-09 LAB — POCT I-STAT 3, VENOUS BLOOD GAS (G3P V)
Acid-base deficit: 3 — ABNORMAL HIGH
Bicarbonate: 23.1
O2 Saturation: 80
TCO2: 24
pCO2, Ven: 45.8
pH, Ven: 7.31 — ABNORMAL HIGH
pO2, Ven: 49 — ABNORMAL HIGH

## 2011-04-09 LAB — COMPREHENSIVE METABOLIC PANEL
ALT: 13
AST: 23
Albumin: 3.7
Alkaline Phosphatase: 75
BUN: 14
CO2: 24
Calcium: 10.6 — ABNORMAL HIGH
Chloride: 101
Creatinine, Ser: 0.79
GFR calc Af Amer: >60
GFR calc non Af Amer: >60
Glucose, Bld: 111 — ABNORMAL HIGH
Potassium: 3.8
Sodium: 134 — ABNORMAL LOW
Total Bilirubin: 0.5
Total Protein: 7.1

## 2011-04-09 LAB — HEPARIN ANTIBODY SCREEN: Heparin Antibody Screen: NEGATIVE

## 2011-04-09 LAB — POCT I-STAT GLUCOSE
Glucose, Bld: 114 — ABNORMAL HIGH
Operator id: 3342

## 2011-04-09 LAB — HEMOGLOBIN AND HEMATOCRIT, BLOOD
HCT: 26.6 — ABNORMAL LOW
Hemoglobin: 8.8 — ABNORMAL LOW

## 2011-04-09 LAB — CREATININE, SERUM
Creatinine, Ser: 0.7
Creatinine, Ser: 0.7
GFR calc Af Amer: >60
GFR calc Af Amer: >60
GFR calc non Af Amer: >60
GFR calc non Af Amer: >60

## 2011-04-09 LAB — POCT CARDIAC MARKERS
CKMB, poc: 2.4
Myoglobin, poc: 105
Troponin i, poc: <0.05

## 2011-04-09 LAB — ABO/RH: ABO/RH(D): O POS

## 2011-04-19 ENCOUNTER — Encounter: Payer: Self-pay | Admitting: Nurse Practitioner

## 2011-04-22 ENCOUNTER — Encounter: Payer: Self-pay | Admitting: Nurse Practitioner

## 2011-04-22 ENCOUNTER — Ambulatory Visit (INDEPENDENT_AMBULATORY_CARE_PROVIDER_SITE_OTHER): Payer: Medicare Other | Admitting: Nurse Practitioner

## 2011-04-22 VITALS — BP 138/72 | HR 63 | Ht 62.0 in | Wt 116.8 lb

## 2011-04-22 DIAGNOSIS — E785 Hyperlipidemia, unspecified: Secondary | ICD-10-CM | POA: Insufficient documentation

## 2011-04-22 DIAGNOSIS — I2510 Atherosclerotic heart disease of native coronary artery without angina pectoris: Secondary | ICD-10-CM | POA: Insufficient documentation

## 2011-04-22 DIAGNOSIS — R55 Syncope and collapse: Secondary | ICD-10-CM

## 2011-04-22 DIAGNOSIS — K219 Gastro-esophageal reflux disease without esophagitis: Secondary | ICD-10-CM

## 2011-04-22 LAB — CBC WITH DIFFERENTIAL/PLATELET
Basophils Absolute: 0 10*3/uL (ref 0.0–0.1)
Basophils Relative: 0.4 % (ref 0.0–3.0)
Eosinophils Absolute: 0.1 10*3/uL (ref 0.0–0.7)
Eosinophils Relative: 0.9 % (ref 0.0–5.0)
HCT: 31.4 % — ABNORMAL LOW (ref 36.0–46.0)
Hemoglobin: 10.4 g/dL — ABNORMAL LOW (ref 12.0–15.0)
Lymphocytes Relative: 23.2 % (ref 12.0–46.0)
Lymphs Abs: 1.3 10*3/uL (ref 0.7–4.0)
MCHC: 33 g/dL (ref 30.0–36.0)
MCV: 93.9 fl (ref 78.0–100.0)
Monocytes Absolute: 0.3 10*3/uL (ref 0.1–1.0)
Monocytes Relative: 6 % (ref 3.0–12.0)
Neutro Abs: 4 10*3/uL (ref 1.4–7.7)
Neutrophils Relative %: 69.5 % (ref 43.0–77.0)
Platelets: 193 10*3/uL (ref 150.0–400.0)
RBC: 3.35 Mil/uL — ABNORMAL LOW (ref 3.87–5.11)
RDW: 13.5 % (ref 11.5–14.6)
WBC: 5.7 10*3/uL (ref 4.5–10.5)

## 2011-04-22 LAB — BASIC METABOLIC PANEL
BUN: 23 mg/dL (ref 6–23)
CO2: 27 mEq/L (ref 19–32)
Calcium: 9.9 mg/dL (ref 8.4–10.5)
Chloride: 103 mEq/L (ref 96–112)
Creatinine, Ser: 0.9 mg/dL (ref 0.4–1.2)
GFR: 62.47 mL/min (ref >60.00)
Glucose, Bld: 96 mg/dL (ref 70–99)
Potassium: 4.8 mEq/L (ref 3.5–5.1)
Sodium: 140 mEq/L (ref 135–145)

## 2011-04-22 MED ORDER — ESOMEPRAZOLE MAGNESIUM 40 MG PO CPDR: 40.0000 mg | DELAYED_RELEASE_CAPSULE | Freq: Every day | ORAL | Status: DC

## 2011-04-22 NOTE — Patient Instructions (Signed)
Let me know if you have any recurrent episodes of passing out.  I am going to put a monitor on to watch your heart rhythm for the next month.  We are going to check some labs today  I will see you in a month.  Call for any problems.

## 2011-04-22 NOTE — Progress Notes (Signed)
Laura Woodard Date of Birth: [birth year]   History of Present Illness: Laura Woodard is seen today for her one year check. She is seen for Dr. [place]. She is a former patient of Dr. Ronnald Nian. She has had a good year overall. She remains busy caring for her husband with dementia. She sleeps poorly, but that is a chronic issue. No chest pain or shortness of breath. She did have an episode about 2 weeks ago with near syncope. She was outside with her son and told him that she was dizzy. She did not go "entirely out" per his report but he noted that her eyes rolled back in her head. No loss of bladder or bowel. No seizure activity. EMS was called and she "checked out ok". She refused transfer to the hospital. She denies any palpitations prior to the event and really had no warning and no symptoms. No recurrence since that time.   Current Outpatient Prescriptions on File Prior to Visit  Medication Sig Dispense Refill  . amLODipine (NORVASC) 5 MG tablet Take 5 mg by mouth daily.        Marland Kitchen aspirin 81 MG tablet Take 81 mg by mouth daily.        Marland Kitchen atorvastatin (LIPITOR) 20 MG tablet Take 20 mg by mouth daily.        . Cholecalciferol (VITAMIN D3) 2000 UNITS TABS Take by mouth daily.        . Ginkgo Biloba 120 MG TABS Take by mouth daily.        Marland Kitchen lisinopril-hydrochlorothiazide (PRINZIDE,ZESTORETIC) 20-12.5 MG per tablet Take 1 tablet by mouth daily.        . Multiple Vitamin (MULTIVITAMIN) tablet Take 1 tablet by mouth daily.        . Omega-3 Fatty Acids (FISH OIL) 1200 MG CAPS Take 1 capsule by mouth daily.        Marland Kitchen DISCONTD: metoprolol succinate (TOPROL-XL) 25 MG 24 hr tablet Take 25 mg by mouth 2 (two) times daily.          No Known Allergies  Past Medical History  Diagnosis Date  . Hypertension   . Hyperlipidemia   . Coronary artery disease     s/p CABG in November 2009  . GERD (gastroesophageal reflux disease)     Past Surgical History  Procedure Date  . Replacement total knee bilateral    . Cholecystectomy   . Coronary artery bypass graft November 2009    LIMA to LAD, SVG to DX, SVG to 1st OM and SVG to RCA    History  Smoking status  . Never Smoker   Smokeless tobacco  . Not on file    History  Alcohol Use No    History reviewed. No pertinent family history.  Review of Systems: The review of systems is positive for trouble sleeping and some headaches.  She wants to try a different PPI for her reflux symptoms. She says her Protonix is not helping. She has arthritis. All other systems were reviewed and are negative.  Physical Exam: BP 138/72  Pulse 63  Ht 5\' 2"  (1.575 m)  Wt 116 lb 12.8 oz (52.98 kg)  BMI 21.36 kg/m2 Patient is very pleasant and in no acute distress. She looks younger than her stated age.  Skin is warm and dry. Color is normal.  HEENT is unremarkable. Normocephalic/atraumatic. PERRL. Sclera are nonicteric. Neck is supple. No masses. No JVD. Lungs are clear. Cardiac exam shows a regular rate and rhythm.  No murmur. Abdomen is soft. Extremities are without edema. Gait and ROM are intact. No gross neurologic deficits noted.  LABORATORY DATA: EKG shows sinus rhythm.    Assessment / Plan:

## 2011-04-22 NOTE — Assessment & Plan Note (Signed)
She has had recent labs with her PCP.

## 2011-04-22 NOTE — Assessment & Plan Note (Signed)
She has had prior CABG back in 2009. No chest pain.

## 2011-04-22 NOTE — Assessment & Plan Note (Signed)
She wants to switch to a different PPI. We will try some Nexium 40 mg.

## 2011-04-22 NOTE — Assessment & Plan Note (Addendum)
She really doesn't seem to be bothered by this. She doesn't want anything done. She is agreeable to an event monitor and some repeat labs today. I explained that if we have recurrence, she will need further testing and she is agreeable to that plan. I will see her back in a month. Patient is agreeable to this plan and will call if any problems develop in the interim.

## 2011-04-29 ENCOUNTER — Telehealth: Payer: Self-pay | Admitting: *Deleted

## 2011-04-29 NOTE — Telephone Encounter (Signed)
Notified of lab results. Will send copy to Dr. Lysbeth Galas.

## 2011-04-29 NOTE — Telephone Encounter (Signed)
Message copied by Lorayne Bender on Mon Apr 29, 2011  5:36 PM ------      Message from: Rosalio Macadamia      Created: Tue Apr 23, 2011  8:06 AM       Ok to report. Labs are satisfactory. Remains a little anemic. Send copy to Dr. Lysbeth Galas. Will see what the event monitor shows over this next month.

## 2011-05-23 ENCOUNTER — Encounter: Payer: Self-pay | Admitting: Nurse Practitioner

## 2011-05-23 ENCOUNTER — Ambulatory Visit (INDEPENDENT_AMBULATORY_CARE_PROVIDER_SITE_OTHER): Payer: Medicare Other | Admitting: Nurse Practitioner

## 2011-05-23 VITALS — BP 148/72 | HR 64 | Ht 62.0 in | Wt 116.0 lb

## 2011-05-23 DIAGNOSIS — K219 Gastro-esophageal reflux disease without esophagitis: Secondary | ICD-10-CM

## 2011-05-23 DIAGNOSIS — R55 Syncope and collapse: Secondary | ICD-10-CM

## 2011-05-23 DIAGNOSIS — I2510 Atherosclerotic heart disease of native coronary artery without angina pectoris: Secondary | ICD-10-CM

## 2011-05-23 MED ORDER — PANTOPRAZOLE SODIUM 40 MG PO TBEC: 40.0000 mg | DELAYED_RELEASE_TABLET | Freq: Every day | ORAL | Status: DC

## 2011-05-23 NOTE — Assessment & Plan Note (Signed)
No symptoms reported. Will continue to monitor.

## 2011-05-23 NOTE — Progress Notes (Signed)
    Laura Woodard Date of Birth: [birth year] Medical Record #[id number]  History of Present Illness: Laura Woodard is seen back today for a one month check. She is seen for Dr. Sw[place]il[place]. She is a former patient of Dr. Ronnald Nian. She had a near syncopal spell about 6 weeks ago. We placed an event monitor. It was ok. She did not wish for any further testing. She has not had any recurrence. She is doing well. No chest pain or shortness of breath. She continues to care for her husband who has dementia.   Current Outpatient Prescriptions on File Prior to Visit  Medication Sig Dispense Refill  . amLODipine (NORVASC) 5 MG tablet Take 5 mg by mouth daily.        Marland Kitchen aspirin 81 MG tablet Take 81 mg by mouth daily.        Marland Kitchen atorvastatin (LIPITOR) 20 MG tablet Take 20 mg by mouth daily.        . Cholecalciferol (VITAMIN D3) 2000 UNITS TABS Take by mouth daily.        . Ginkgo Biloba 120 MG TABS Take by mouth daily.        Marland Kitchen lisinopril-hydrochlorothiazide (PRINZIDE,ZESTORETIC) 20-12.5 MG per tablet Take 1 tablet by mouth daily.        . metoprolol (TOPROL-XL) 50 MG 24 hr tablet Take 50 mg by mouth daily.        . Multiple Vitamin (MULTIVITAMIN) tablet Take 1 tablet by mouth daily.        . Omega-3 Fatty Acids (FISH OIL) 1200 MG CAPS Take 1 capsule by mouth daily.          No Known Allergies  Past Medical History  Diagnosis Date  . Hypertension   . Hyperlipidemia   . Coronary artery disease     s/p CABG in November 2009  . GERD (gastroesophageal reflux disease)   . Headache   . SOB (shortness of breath)     Past Surgical History  Procedure Date  . Replacement total knee bilateral   . Cholecystectomy   . Coronary artery bypass graft November 2009    LIMA to LAD, SVG to DX, SVG to 1st OM and SVG to RCA  . Cardiac catheterization 05/16/2008    EF 70+  . US echocardiography 05/12/2008    EF 55-60%  . Cardiovascular stress test 05/11/2008    EF 61%.ABNORMAL STRESS NUCLEAR STUDY. FINDINGS ARE  CONSISTENT WITH ANTERIOR ISCHEMIA    History  Smoking status  . Never Smoker   Smokeless tobacco  . Not on file    History  Alcohol Use No    Family History  Problem Relation Age of Onset  . Lung cancer Mother   . Coronary artery disease Father     Review of Systems: The review of systems is per the HPI.  All other systems were reviewed and are negative.  Physical Exam: BP 148/72  Pulse 64  Ht 5\' 2"  (1.575 m)  Wt 116 lb (52.617 kg)  BMI 21.22 kg/m2 Patient is very pleasant and in no acute distress. Skin is warm and dry. Color is normal.  HEENT is unremarkable. Normocephalic/atraumatic. PERRL. Sclera are nonicteric. Neck is supple. No masses. No JVD. Lungs are clear. Cardiac exam shows a regular rate and rhythm. Abdomen is soft. Extremities are without edema. Gait and ROM are intact. No gross neurologic deficits noted.  LABORATORY DATA:   Assessment / Plan:

## 2011-05-23 NOTE — Assessment & Plan Note (Signed)
She has had no further spells. She is doing well. Her monitor was ok. We will see her back in a year. She will let us know if she has recurrent problems. Patient is agreeable to this plan and will call if any problems develop in the interim.

## 2011-05-23 NOTE — Assessment & Plan Note (Signed)
I have switched her back to Protonix per her request today.

## 2011-05-23 NOTE — Patient Instructions (Signed)
I have refilled your Protonix.  I think you are doing well. Your monitor was ok. Let us know if you have any more dizzy spells.  We will see you back in a year.

## 2011-07-10 ENCOUNTER — Encounter: Payer: Self-pay | Admitting: Cardiology

## 2011-07-26 ENCOUNTER — Other Ambulatory Visit: Payer: Self-pay | Admitting: *Deleted

## 2011-07-26 MED ORDER — METOPROLOL SUCCINATE ER 50 MG PO TB24: 50.0000 mg | ORAL_TABLET | Freq: Every day | ORAL | Status: DC

## 2011-12-13 ENCOUNTER — Emergency Department (HOSPITAL_COMMUNITY)
Admission: EM | Admit: 2011-12-13 | Discharge: 2011-12-13 | Disposition: A | Discharge status: Home / Self Care | Payer: Medicare Other | Attending: Emergency Medicine | Admitting: Emergency Medicine

## 2011-12-13 ENCOUNTER — Emergency Department (HOSPITAL_COMMUNITY): Payer: Medicare Other

## 2011-12-13 ENCOUNTER — Encounter (HOSPITAL_COMMUNITY): Payer: Self-pay | Admitting: *Deleted

## 2011-12-13 DIAGNOSIS — I25810 Atherosclerosis of coronary artery bypass graft(s) without angina pectoris: Secondary | ICD-10-CM | POA: Insufficient documentation

## 2011-12-13 DIAGNOSIS — W19XXXA Unspecified fall, initial encounter: Secondary | ICD-10-CM

## 2011-12-13 DIAGNOSIS — I10 Essential (primary) hypertension: Secondary | ICD-10-CM | POA: Insufficient documentation

## 2011-12-13 DIAGNOSIS — S0003XA Contusion of scalp, initial encounter: Secondary | ICD-10-CM | POA: Insufficient documentation

## 2011-12-13 DIAGNOSIS — IMO0002 Reserved for concepts with insufficient information to code with codable children: Secondary | ICD-10-CM | POA: Insufficient documentation

## 2011-12-13 DIAGNOSIS — W010XXA Fall on same level from slipping, tripping and stumbling without subsequent striking against object, initial encounter: Secondary | ICD-10-CM | POA: Insufficient documentation

## 2011-12-13 DIAGNOSIS — S0083XA Contusion of other part of head, initial encounter: Secondary | ICD-10-CM

## 2011-12-13 DIAGNOSIS — Z96659 Presence of unspecified artificial knee joint: Secondary | ICD-10-CM | POA: Insufficient documentation

## 2011-12-13 DIAGNOSIS — Z79899 Other long term (current) drug therapy: Secondary | ICD-10-CM | POA: Insufficient documentation

## 2011-12-13 DIAGNOSIS — H5710 Ocular pain, unspecified eye: Secondary | ICD-10-CM | POA: Insufficient documentation

## 2011-12-13 DIAGNOSIS — R220 Localized swelling, mass and lump, head: Secondary | ICD-10-CM | POA: Insufficient documentation

## 2011-12-13 DIAGNOSIS — E785 Hyperlipidemia, unspecified: Secondary | ICD-10-CM | POA: Insufficient documentation

## 2011-12-13 MED ORDER — TETANUS-DIPHTH-ACELL PERTUSSIS 5-2.5-18.5 LF-MCG/0.5 IM SUSP
0.5000 mL | Freq: Once | INTRAMUSCULAR | Status: AC
  Administered 2011-12-13: 0.5 mL via INTRAMUSCULAR
  Filled 2011-12-13: qty 0.5

## 2011-12-13 NOTE — ED Notes (Signed)
Dr. Knapp at bedside to examine. 

## 2011-12-13 NOTE — Discharge Instructions (Signed)
Ice packs to the swollen areas the next 3-5 days or until the swelling goes away. You can safely take tylenol if needed for pain.  Return to the ED for any problems listed on the head injury sheet.

## 2011-12-13 NOTE — ED Notes (Signed)
States tripped on stepping stone and fell, landing on face; denies LOC; c/o headache and left eye pain; hematoma noted to left forehead and around left eye. Alert, answers questions appropriately.

## 2011-12-13 NOTE — ED Provider Notes (Cosign Needed)
This chart was scribed for Ward Givens, MD by Wallis Mart. The patient was seen in room APA14/APA14 and the patient's care was started at 2:37 PM.   CSN: [id number]  Arrival date & time 12/13/11  1330   First MD Initiated Contact with Patient 12/13/11 1406      Chief Complaint  Patient presents with  . Fall  . Head Injury    (Consider location/radiation/quality/duration/timing/severity/associated sxs/prior treatment) HPI  Laura Woodard is a 78 y.o. female who presents to the Emergency Department complaining of a fall that occurred about 3 hours ago.  Pt states that she tripped over a walking stone and fell, landed on face. Denies LOC. Pt was able to get up and return to her house.   She applied an ice pack to injury w/  relief. Denies any headache,  vision changes, nausea, vomiting, numbness in arms or legs. Pt takes aspirin  81 mg daily.  Pt denies smoking or drinking. There are no other associated symptoms and no other alleviating or aggravating factors. She denies any other injury other than some abrasions.     PCP: [facility]   Past Medical History  Diagnosis Date  . Hypertension   . Hyperlipidemia   . Coronary artery disease     s/p CABG in November 2009  . GERD (gastroesophageal reflux disease)   . Headache     Past Surgical History  Procedure Date  . Replacement total knee bilateral   . Cholecystectomy   . Coronary artery bypass graft November 2009    LIMA to LAD, SVG to DX, SVG to 1st OM and SVG to RCA  . Cardiac catheterization 05/16/2008    EF 70+  . US echocardiography 05/12/2008    EF 55-60%  . Cardiovascular stress test 05/11/2008    EF 61%.ABNORMAL STRESS NUCLEAR STUDY. FINDINGS ARE CONSISTENT WITH ANTERIOR ISCHEMIA    Family History  Problem Relation Age of Onset  . Lung cancer Mother   . Coronary artery disease Father     History  Substance Use Topics  . Smoking status: Never Smoker   . Smokeless tobacco: Not on file  . Alcohol Use: No    lives at home Lives alone  [place] History    Grav Para Term Preterm Abortions TAB SAB Ect Mult Living                  Last tetanus unknown   Review of Systems  10 Systems reviewed and all are negative for acute change except as noted in the HPI.    Allergies  Review of patient's allergies indicates no known allergies.  Home Medications   Current Outpatient Rx  Name Route Sig Dispense Refill  . AMLODIPINE BESYLATE 5 MG PO TABS Oral Take 5 mg by mouth daily.      . ASPIRIN 81 MG PO TABS Oral Take 81 mg by mouth daily.      . ATORVASTATIN CALCIUM 20 MG PO TABS Oral Take 20 mg by mouth daily.      Marland Kitchen VITAMIN D3 2000 UNITS PO TABS Oral Take by mouth daily.      Marland Kitchen GINKGO BILOBA 120 MG PO TABS Oral Take by mouth daily.      Marland Kitchen LISINOPRIL-HYDROCHLOROTHIAZIDE 20-12.5 MG PO TABS Oral Take 1 tablet by mouth daily.      Marland Kitchen METOPROLOL SUCCINATE ER 50 MG PO TB24 Oral Take 1 tablet (50 mg total) by mouth daily. 30 tablet 6  . ONE-DAILY MULTI VITAMINS  PO TABS Oral Take 1 tablet by mouth daily.      Marland Kitchen FISH OIL 1200 MG PO CAPS Oral Take 1 capsule by mouth daily.      Marland Kitchen PANTOPRAZOLE SODIUM 40 MG PO TBEC Oral Take 1 tablet (40 mg total) by mouth daily. 90 tablet 3    BP 169/60  Pulse 64  Temp(Src) 97.9 F (36.6 C) (Oral)  Resp 20  Ht 5\' 2"  (1.575 m)  Wt 119 lb (53.978 kg)  BMI 21.77 kg/m2  SpO2 100%  Vital signs normal    Physical Exam  Nursing note and vitals reviewed. Constitutional: She is oriented to person, place, and time. She appears well-developed and well-nourished. No distress.  HENT:  Head: Normocephalic and atraumatic.  Right Ear: External ear normal.  Left Ear: External ear normal.  Nose: Nose normal.  Mouth/Throat: Oropharynx is clear and moist.       bruising and swelling to left  forehead with hematoma of medial left upper eyelid, brusing of left cheek, superficial abrasion between left nostril and lip.   Eyes: Conjunctivae and EOM are normal. Pupils are equal,  round, and reactive to light.  Neck: Neck supple. No tracheal deviation present.  Cardiovascular: Normal rate, regular rhythm and normal heart sounds.   Pulmonary/Chest: Effort normal and breath sounds normal. No respiratory distress. She has no wheezes. She has no rales.  Abdominal: Soft. She exhibits no distension.  Musculoskeletal: Normal range of motion. She exhibits no edema.       Abrasion of left shoulder but normal ROM w/o pain, tiny one half cm laceration, superficial to left elbow, no joint effusion, small superficial abrasion of left interior knee without pain on ROM, s/p bilateral TKR    Neurological: She is alert and oriented to person, place, and time. No sensory deficit.  Skin: Skin is warm and dry.  Psychiatric: She has a normal mood and affect. Her behavior is normal.    ED Course  Procedures (including critical care time)   Medications  TDaP (BOOSTRIX) injection 0.5 mL (not administered)       DIAGNOSTIC STUDIES: Oxygen Saturation is 100% on room air, normal by my interpretation.    COORDINATION OF CARE: No results found.   Ct Head Wo Contrast Ct Maxillofacial Wo Cm  12/13/2011  *RADIOLOGY REPORT*  Clinical Data:  Fall.  Hit left side of face with eye pain, swelling, and bruising  CT HEAD WITHOUT CONTRAST CT MAXILLOFACIAL WITHOUT CONTRAST  Technique:  Multidetector CT imaging of the head and maxillofacial structures were performed using the standard protocol without intravenous contrast. Multiplanar CT image reconstructions of the maxillofacial structures were also generated.  Comparison:   None.  CT HEAD  Findings: There is a left frontal scalp hematoma that measures 3.8 cm transverse diameter by 1.0 cm AP diameter. Contiguous with the frontal scalp hematoma is a left preseptal periorbital hematoma that measures approximately 2.9 transverse by 1.3 cm AP diameter. There are some locules of subcutaneous gas within the left periorbital hematoma.  The globes and lenses  are intact.  The retro- orbital fat planes are normal.  No acute intracranial abnormality is identified.  Specifically, no hemorrhage, hydrocephalus, mass effect, mass lesion, or evidence of acute cortically based infarction.  The skull is intact.  Mastoid air cells are clear.  IMPRESSION:  1. Left frontal scalp and left periorbital hematomas. There is some subcutaneous gas within the periorbital hematoma. 2.  No acute intracranial abnormality or skull fracture.  CT MAXILLOFACIAL  Findings:   There is preseptal left periorbital hematoma with scattered locules of subcutaneous gas.  Left frontal scalp hematoma is also present.  There is overlying bandage material.  No radiopaque foreign bodies seen within the soft tissues.  The globes and orbits are intact.  Mandibular condyles are located.  There is degenerative change of the temporomandibular joints, left greater than right.   Facial bones are intact. Negative for acute facial bone fracture. Aside from slight focal mucosal thickening in the posterior right sphenoid sinus and in the inferior right maxillary sinus, the paranasal sinuses are clear.  IMPRESSION:  1.  Left periorbital and left frontal scalp hematomas. Locules of subcutaneous gas associate with the left periorbital hematoma. 2.  Negative for facial fracture. 3.  Degenerative changes of the temporomandibular joints, left greater than right.  Original Report Authenticated By: Britta Mccreedy, M.D.    Date: 01/02/2012  Rate: 66  Rhythm: normal sinus rhythm  QRS Axis: normal  Intervals: normal  ST/T Wave abnormalities: normal  Conduction Disutrbances:none  Narrative Interpretation: Q waves in septal and inf leads  Old EKG Reviewed: unchanged     1. Fall   2. Contusion of face    Plan discharge   Devoria Albe, MD, FACEP    MDM   I personally performed the services described in this documentation, which was scribed in my presence. The recorded information has been reviewed and  considered.  Devoria Albe, MD, FACEP      Ward Givens, MD 12/13/11 1553  Ward Givens, MD 01/02/12 1553

## 2012-02-26 ENCOUNTER — Other Ambulatory Visit: Payer: Self-pay | Admitting: Nurse Practitioner

## 2012-02-26 ENCOUNTER — Other Ambulatory Visit: Payer: Self-pay | Admitting: *Deleted

## 2012-05-22 ENCOUNTER — Encounter: Payer: Self-pay | Admitting: Nurse Practitioner

## 2012-05-22 ENCOUNTER — Ambulatory Visit (INDEPENDENT_AMBULATORY_CARE_PROVIDER_SITE_OTHER): Payer: Medicare Other | Admitting: Nurse Practitioner

## 2012-05-22 VITALS — BP 132/62 | HR 64 | Ht 62.0 in | Wt 124.6 lb

## 2012-05-22 DIAGNOSIS — I259 Chronic ischemic heart disease, unspecified: Secondary | ICD-10-CM

## 2012-05-22 MED ORDER — PANTOPRAZOLE SODIUM 40 MG PO TBEC: 40.0000 mg | DELAYED_RELEASE_TABLET | Freq: Every day | ORAL | Status: DC

## 2012-05-22 NOTE — Progress Notes (Signed)
[name] Date of Birth: January 08, 1924 Medical Record #[id number]  History of Present Illness: Laura Woodard is seen back today for her one year check. She is seen for Dr. [place]. She is a former patient of Dr. Ronnald Nian. She has known CAD with remote CABG in  2009. Other issues include advanced age, HTN and HLD.   She comes in today. She is here with her son. She is doing well. Has had her labs with Dr. Lysbeth Galas and sees him again in January. No chest pain. Not short of breath. Fell back in the summer but not injured. Using a cane sometimes. She will get some chest soreness but her son says she "does things she shouldn't" such as moving furniture. Chest gets sore to touch. Tolerating her medicines. Overall, she thinks she is doing ok. No syncope. Did have a presyncopal spell back in October that was related to a GI bug she had picked up.   Current Outpatient Prescriptions on File Prior to Visit  Medication Sig Dispense Refill  . acetaminophen (TYLENOL) 500 MG tablet Take 500 mg by mouth every 6 (six) hours as needed. Pain       . amLODipine (NORVASC) 5 MG tablet Take 5 mg by mouth daily.        Marland Kitchen aspirin 81 MG tablet Take 81 mg by mouth daily.        Marland Kitchen atorvastatin (LIPITOR) 20 MG tablet Take 20 mg by mouth daily.        . Cholecalciferol (VITAMIN D3) 2000 UNITS TABS Take 2,000 Units by mouth daily.       . Ginkgo Biloba 120 MG TABS Take 120 mg by mouth daily.       Marland Kitchen lisinopril-hydrochlorothiazide (PRINZIDE,ZESTORETIC) 20-12.5 MG per tablet Take 1 tablet by mouth daily.        . Multiple Vitamin (MULTIVITAMIN) tablet Take 1 tablet by mouth daily.        . Omega-3 Fatty Acids (FISH OIL) 1200 MG CAPS Take 1 capsule by mouth daily.        . Simethicone (GAS-X PO) Take 1 tablet by mouth daily as needed. Indigestion       . TOPROL XL 50 MG 24 hr tablet TAKE 1 TABLET DAILY  30 each  3  . zolpidem (AMBIEN) 5 MG tablet Take 5 mg by mouth at bedtime as needed.       . [DISCONTINUED] pantoprazole  (PROTONIX) 40 MG tablet Take 1 tablet (40 mg total) by mouth daily.  90 tablet  3    No Known Allergies  Past Medical History  Diagnosis Date  . Hypertension   . Hyperlipidemia   . Coronary artery disease     s/p CABG in November 2009  . GERD (gastroesophageal reflux disease)   . Headache     Past Surgical History  Procedure Date  . Replacement total knee bilateral   . Cholecystectomy   . Coronary artery bypass graft November 2009    LIMA to LAD, SVG to DX, SVG to 1st OM and SVG to RCA  . Cardiac catheterization 05/16/2008    EF 70+  . US echocardiography 05/12/2008    EF 55-60%  . Cardiovascular stress test 05/11/2008    EF 61%.ABNORMAL STRESS NUCLEAR STUDY. FINDINGS ARE CONSISTENT WITH ANTERIOR ISCHEMIA    History  Smoking status  . Never Smoker   Smokeless tobacco  . Not on file    History  Alcohol Use No    Family History  Problem  Relation Age of Onset  . Lung cancer Mother   . Coronary artery disease Father     Review of Systems: The review of systems is per the HPI.  All other systems were reviewed and are negative.  Physical Exam: BP 132/62  Pulse 64  Ht 5\' 2"  (1.575 m)  Wt 124 lb 9.6 oz (56.518 kg)  BMI 22.79 kg/m2 Patient is very pleasant and in no acute distress. Skin is warm and dry. Color is normal.  HEENT is unremarkable. Normocephalic/atraumatic. PERRL. Sclera are nonicteric. Neck is supple. No masses. No JVD. Lungs are clear. Cardiac exam shows a regular rate and rhythm. She does have palpable chest wall pain. Abdomen is soft. Extremities are without edema. Gait and ROM are intact. No gross neurologic deficits noted.  LABORATORY DATA: n/a   Assessment / Plan: 1. CAD - doing well.   2. HTN - blood pressure looks good.  3. HLD - on statin therapy. Labs are checked by her PCP.   4. Advanced age - encouraged her to stay active. No falls. Use her cane.   Call the [facility] office at [phone] if you have any questions,  problems or concerns.

## 2012-05-22 NOTE — Patient Instructions (Signed)
I think you are doing well.  Stay active but be careful. No falls!  Stay on your current medicines  I will see you in a year  Call the [facility] office at [phone] if you have any questions, problems or concerns.

## 2012-06-24 ENCOUNTER — Other Ambulatory Visit: Payer: Self-pay | Admitting: Nurse Practitioner

## 2012-07-04 ENCOUNTER — Other Ambulatory Visit: Payer: Self-pay | Admitting: Nurse Practitioner

## 2012-08-22 ENCOUNTER — Other Ambulatory Visit: Payer: Self-pay

## 2012-11-25 ENCOUNTER — Ambulatory Visit (INDEPENDENT_AMBULATORY_CARE_PROVIDER_SITE_OTHER): Payer: Medicare Other | Admitting: Nurse Practitioner

## 2012-11-25 ENCOUNTER — Telehealth: Payer: Self-pay | Admitting: Nurse Practitioner

## 2012-11-25 ENCOUNTER — Encounter: Payer: Self-pay | Admitting: Nurse Practitioner

## 2012-11-25 VITALS — BP 130/66 | HR 64 | Ht 62.0 in | Wt 121.4 lb

## 2012-11-25 DIAGNOSIS — I259 Chronic ischemic heart disease, unspecified: Secondary | ICD-10-CM

## 2012-11-25 MED ORDER — LISINOPRIL 20 MG PO TABS: 20.0000 mg | ORAL_TABLET | Freq: Every day | ORAL | Status: DC

## 2012-11-25 NOTE — Progress Notes (Addendum)
[name] Date of Birth: [birth year] Medical Record #[id number]  History of Present Illness: Laura Woodard is seen back today for a work in visit. She is seen for Dr. [place]. She is a former patient of Dr. Ronnald Nian. She hsa known CAD with remote CABG back in 2009. Other isues include advanced age, HTN and HLD.   Last seen in November. Seemed to be doing ok.   Comes back today. This is an early visit. She is here with her son. She was taken to the ER at [facility] on April 17th. A week prior to this - she passed out while sitting under the hair dryer at the beauty shop.  A few days later, she was out on her back porch and passed out again while doing the laundry. Then went to the ER. Found to have a UTI and was told she was orthostatic. Had no med changes done. No more syncope. Was not feeling well at the first part of May and went to see Dr. Lysbeth Galas. UTI still not resolved and she was retreated. Felt to be depressed and she was not sleeping - given Zoloft and Ambien. She has stopped the Zoloft after taking for 18 days because of poor appetite and feeling worse. She is now feeling better. Still no more syncopal spells. Says she had a CT of her head and thinks she had carotids done. BP at home has been running as low as 117 systolic. She does not drink enough water according to her son. No swelling reported. She has had some chronic soreness over her left chest that is tender to touch.    Current Outpatient Prescriptions on File Prior to Visit  Medication Sig Dispense Refill  . acetaminophen (TYLENOL) 500 MG tablet Take 500 mg by mouth every 6 (six) hours as needed. Pain       . amLODipine (NORVASC) 5 MG tablet Take 5 mg by mouth daily.        Marland Kitchen aspirin 81 MG tablet Take 81 mg by mouth daily.        Marland Kitchen atorvastatin (LIPITOR) 20 MG tablet Take 20 mg by mouth daily.        . Cholecalciferol (VITAMIN D3) 2000 UNITS TABS Take 2,000 Units by mouth daily.       . Ginkgo Biloba 120 MG TABS Take 120 mg by  mouth daily.       . metoprolol succinate (TOPROL-XL) 50 MG 24 hr tablet TAKE 1 TABLET DAILY  30 tablet  6  . Multiple Vitamin (MULTIVITAMIN) tablet Take 1 tablet by mouth daily.        . Omega-3 Fatty Acids (FISH OIL) 1200 MG CAPS Take 1 capsule by mouth daily.        . pantoprazole (PROTONIX) 40 MG tablet Take 1 tablet (40 mg total) by mouth daily.  30 tablet  11  . Simethicone (GAS-X PO) Take 1 tablet by mouth daily as needed. Indigestion       . zolpidem (AMBIEN) 5 MG tablet Take 2.5 mg by mouth at bedtime as needed.        No current facility-administered medications on file prior to visit.    No Known Allergies  Past Medical History  Diagnosis Date  . Hypertension   . Hyperlipidemia   . Coronary artery disease     s/p CABG in November 2009  . GERD (gastroesophageal reflux disease)   . Headache     Past Surgical History  Procedure Laterality Date  .  Replacement total knee bilateral    . Cholecystectomy    . Coronary artery bypass graft  November 2009    LIMA to LAD, SVG to DX, SVG to 1st OM and SVG to RCA  . Cardiac catheterization  05/16/2008    EF 70+  . US echocardiography  05/12/2008    EF 55-60%  . Cardiovascular stress test  05/11/2008    EF 61%.ABNORMAL STRESS NUCLEAR STUDY. FINDINGS ARE CONSISTENT WITH ANTERIOR ISCHEMIA    History  Smoking status  . Never Smoker   Smokeless tobacco  . Not on file    History  Alcohol Use No    Family History  Problem Relation Age of Onset  . Lung cancer Mother   . Coronary artery disease Father     Review of Systems: The review of systems is per the HPI.  All other systems were reviewed and are negative.  Physical Exam: BP 130/66  Pulse 64  Ht 5\' 2"  (1.575 m)  Wt 121 lb 6.4 oz (55.067 kg)  BMI 22.2 kg/m2 Patient is very pleasant and in no acute distress. She is quite hard of hearing. BP is 140/70 sitting and standing for me. Weight is down 3 pounds since her last visit. Skin is warm and dry. Color is normal.   HEENT is unremarkable. Normocephalic/atraumatic. PERRL. Sclera are nonicteric. Neck is supple. No masses. No JVD. Lungs are clear. Cardiac exam shows a regular rate and rhythm. Abdomen is soft. Extremities are without edema. Gait and ROM are intact. No gross neurologic deficits noted.  LABORATORY DATA: EKG today shows sinus rhythm. Prior septal infarct. No acute changes. PAC's noted.   Lab Results  Component Value Date   WBC 5.7 04/22/2011   HGB 10.4* 04/22/2011   HCT 31.4* 04/22/2011   PLT 193.0 04/22/2011   GLUCOSE 96 04/22/2011   ALT 13 05/18/2008   AST 23 05/18/2008   NA 140 04/22/2011   K 4.8 04/22/2011   CL 103 04/22/2011   CREATININE 0.9 04/22/2011   BUN 23 04/22/2011   CO2 27 04/22/2011   INR 1.9* 05/20/2008   HGBA1C  Value: 6.2 (NOTE)   The ADA recommends the following therapeutic goal for glycemic   control related to Hgb A1C measurement:   Goal of Therapy:   < 7.0% Hgb A1C   Reference: American Diabetes Association: Clinical Practice   Recommendations 2008, Diabetes Care,  2008, 31:(Suppl 1).* 05/18/2008    Assessment / Plan:  1. CAD - remote CABG in 2009 - no recurrent angina  2. Syncope in the setting of an UTI - no recurrence reported. Was told that she was orthostatic. Will obtain records. I have stopped her Lisinopril HCT and switched her over to just plain Lisinopril 20 mg a day. She will continue to monitor her BP at home.   3. Question of depression - felt worse with the Zoloft. I have stopped it today.   I will see her back in a month. Obtain records to see exactly what she had done. Further disposition to follow.    Patient is agreeable to this plan and will call if any problems develop in the interim.   Rosalio Macadamia, RN, ANP-C [facility] [address] [place], [place]  [address]  Addendum: Patient's records from [facility] have been reviewed on Nov 27, 2012. It was felt that she had had a vasovagal faint and was  probably volume depleted, over heated (had been under a hairdryer) and with  a UTI. No CT of the head. No carotid dopplers. Did have a Ct angio of the chest for an elevated d dimer which was negative. Troponins were normal x 3. Potassium was 3.2.   I think if she has any recurrence - she will need further testing.

## 2012-11-25 NOTE — Patient Instructions (Addendum)
Stay off the Sertraline  Sign a release for records from [facility] for admission on 10/22/2012  Stop your Lisinopril HCT  Start plain Lisinopril 20 mg a day in its place  Monitor your blood pressure at home and keep a diary - let us know if you have consistent readings over 160  Stay hydrated  I will see you in a month  Call the [facility] office at [phone] if you have any questions, problems or concerns.

## 2012-11-25 NOTE — Telephone Encounter (Signed)
ROI faxed to [facility] [phone] 11/25/12/KM

## 2012-11-25 NOTE — Telephone Encounter (Signed)
Records received From [facility] gave to [facility]  11/25/12/KM

## 2012-12-23 ENCOUNTER — Ambulatory Visit (INDEPENDENT_AMBULATORY_CARE_PROVIDER_SITE_OTHER): Payer: Medicare Other | Admitting: Nurse Practitioner

## 2012-12-23 ENCOUNTER — Encounter: Payer: Self-pay | Admitting: Nurse Practitioner

## 2012-12-23 VITALS — BP 120/78 | HR 72 | Ht 62.0 in | Wt 120.0 lb

## 2012-12-23 DIAGNOSIS — R55 Syncope and collapse: Secondary | ICD-10-CM

## 2012-12-23 NOTE — Patient Instructions (Signed)
I will see you in 6 months  Stay on your current medicines  Stay active  Let me know if you have any more passing out spells  Call the [facility] office at [phone] if you have any questions, problems or concerns.

## 2012-12-23 NOTE — Progress Notes (Signed)
Laura Woodard Date of Birth: 01-Jun-1924 Medical Record #[id number]  History of Present Illness: Laura Woodard comes back today for a one month check. Seen for Dr. [place]. She has known CAD with remote CABG in 2009, HTN and HLD. She is 79 years of age.   Seen a month ago after going to [facility] for syncope in April. Records were reviewed and it was felt that she had a vasovagal faint and was probably volume depleted, over heated from sitting under a hair dryer and had a UTI. Did not get a CT of her head, nor carotid dopplers but had a CTA of the chest due to elevated d dimer and that was negative. Potassium was 3.2  I cut her HCT out of her Lisinopril and placed her on plain Lisinopril. Also she had stopped her Zoloft and did not wish to resume. She had been given some Ambien as well to help her sleep.   Comes back today. She is here with her son. Doing ok. She is hard of hearing. No more passing out spells. Her son says she is back to her baseline. Back walking and feeling good. Not dizzy or lightheaded. No chest pain but has some generalized soreness with turning, movement, etc that she feels is arthritis. They are both happy with how she is doing. BP has been doing ok at home. No readings over 140 systolic. Some transient edema but now resolved.   Current Outpatient Prescriptions on File Prior to Visit  Medication Sig Dispense Refill  . acetaminophen (TYLENOL) 500 MG tablet Take 500 mg by mouth every 6 (six) hours as needed. Pain       . amLODipine (NORVASC) 5 MG tablet Take 5 mg by mouth daily.        Marland Kitchen aspirin 81 MG tablet Take 81 mg by mouth daily.        Marland Kitchen atorvastatin (LIPITOR) 20 MG tablet Take 20 mg by mouth daily.        . Cholecalciferol (VITAMIN D3) 2000 UNITS TABS Take 2,000 Units by mouth daily.       . Ginkgo Biloba 120 MG TABS Take 120 mg by mouth daily.       Marland Kitchen lisinopril (PRINIVIL,ZESTRIL) 20 MG tablet Take 1 tablet (20 mg total) by mouth daily.  90 tablet  3  . metoprolol  succinate (TOPROL-XL) 50 MG 24 hr tablet TAKE 1 TABLET DAILY  30 tablet  6  . Multiple Vitamin (MULTIVITAMIN) tablet Take 1 tablet by mouth daily.        . Omega-3 Fatty Acids (FISH OIL) 1200 MG CAPS Take 1 capsule by mouth daily.        . pantoprazole (PROTONIX) 40 MG tablet Take 1 tablet (40 mg total) by mouth daily.  30 tablet  11  . Simethicone (GAS-X PO) Take 1 tablet by mouth daily as needed. Indigestion       . vitamin B-12 (CYANOCOBALAMIN) 1000 MCG tablet Take 1,500 mcg by mouth daily.      Marland Kitchen zolpidem (AMBIEN) 5 MG tablet Take 2.5 mg by mouth at bedtime as needed.        No current facility-administered medications on file prior to visit.    No Known Allergies  Past Medical History  Diagnosis Date  . Hypertension   . Hyperlipidemia   . Coronary artery disease     s/p CABG in November 2009  . GERD (gastroesophageal reflux disease)   . [id number](478.2)     Past Surgical History  Procedure Laterality Date  . Replacement total knee bilateral    . Cholecystectomy    . Coronary artery bypass graft  November 2009    LIMA to LAD, SVG to DX, SVG to 1st OM and SVG to RCA  . Cardiac catheterization  05/16/2008    EF 70+  . US echocardiography  05/12/2008    EF 55-60%  . Cardiovascular stress test  05/11/2008    EF 61%.ABNORMAL STRESS NUCLEAR STUDY. FINDINGS ARE CONSISTENT WITH ANTERIOR ISCHEMIA    History  Smoking status  . Never Smoker   Smokeless tobacco  . Not on file    History  Alcohol Use No    Family History  Problem Relation Age of Onset  . Lung cancer Mother   . Coronary artery disease Father     Review of Systems: The review of systems is per the HPI.  All other systems were reviewed and are negative.  Physical Exam: BP 120/78  Pulse 72  Ht 5\' 2"  (1.575 m)  Wt 120 lb (54.432 kg)  BMI 21.94 kg/m2 Patient is very pleasant and in no acute distress. Skin is warm and dry. Color is normal.  HEENT is unremarkable. Normocephalic/atraumatic. PERRL. Sclera are  nonicteric. Neck is supple. No masses. No JVD. Lungs are clear. Cardiac exam shows a regular rate and rhythm. Abdomen is soft. Extremities are without edema. Gait and ROM are intact. No gross neurologic deficits noted.  LABORATORY DATA: N/A    Assessment / Plan: 1. Prior syncope - without recurrence. Her medicines have been cut back. If she has recurrence, she will need further testing. She is advised accordingly.   2. HTN - blood pressure looks good. No change in her medicines.   3. CAD - prior CABG in 2009 - no symptoms reported.   I will see her back in 6 months.   Patient is agreeable to this plan and will call if any problems develop in the interim.   Rosalio Macadamia, RN, ANP-C [facility] [address] [place], [place]  [address]

## 2013-02-10 ENCOUNTER — Other Ambulatory Visit: Payer: Self-pay

## 2013-05-13 ENCOUNTER — Other Ambulatory Visit: Payer: Self-pay

## 2013-10-12 ENCOUNTER — Ambulatory Visit (INDEPENDENT_AMBULATORY_CARE_PROVIDER_SITE_OTHER): Payer: Medicare HMO | Admitting: Nurse Practitioner

## 2013-10-12 ENCOUNTER — Encounter: Payer: Self-pay | Admitting: Nurse Practitioner

## 2013-10-12 VITALS — BP 180/90 | HR 60 | Ht 62.0 in | Wt 121.0 lb

## 2013-10-12 DIAGNOSIS — I10 Essential (primary) hypertension: Secondary | ICD-10-CM

## 2013-10-12 DIAGNOSIS — I259 Chronic ischemic heart disease, unspecified: Secondary | ICD-10-CM

## 2013-10-12 NOTE — Progress Notes (Signed)
Laura Woodard Date of Birth: [birth year] Medical Record #[id number]  History of Present Illness: "Laura Woodard" comes back today for her 6 month check. Seen for Dr. [place]. Now 80 years of age. She has known CAD with remote CABG in 2009, HTN and HLD.   Seen back in May after going to [facility] for syncope in April. Records were reviewed and it was felt that she had a vasovagal faint and was probably volume depleted, over heated from sitting under a hair dryer and had a UTI. Did not get a CT of her head, nor carotid dopplers but had a CTA of the chest due to elevated d dimer and that was negative. Potassium was 3.2. I cut her HCT out of her Lisinopril and placed her on plain Lisinopril. Also she had stopped her Zoloft and did not wish to resume. She had been given some Ambien as well to help her sleep.   Seen back in June and she was doing well.  Comes back today. Here with her son. He says she is doing ok. BP is better at home. Tends to get "excited" coming here. Has her labs by PCP recently. No chest pain. Not short of breath. Has gotten a cat and notes that this has given her great comfort. No falls. Uses a cane when she goes outside. Does use some low dose ambien for sleep.   Current Outpatient Prescriptions  Medication Sig Dispense Refill  . acetaminophen (TYLENOL) 500 MG tablet Take 500 mg by mouth every 6 (six) hours as needed. Pain       . amLODipine (NORVASC) 5 MG tablet Take 5 mg by mouth daily.        Marland Kitchen aspirin 81 MG tablet Take 81 mg by mouth daily.        Marland Kitchen atorvastatin (LIPITOR) 20 MG tablet Take 20 mg by mouth daily.        . Cholecalciferol (VITAMIN D3) 2000 UNITS TABS Take 2,000 Units by mouth daily.       . Ginkgo Biloba 120 MG TABS Take 120 mg by mouth daily.       . metoprolol succinate (TOPROL-XL) 50 MG 24 hr tablet TAKE 1 TABLET DAILY  30 tablet  6  . Multiple Vitamin (MULTIVITAMIN) tablet Take 1 tablet by mouth daily.        . Simethicone (GAS-X PO) Take 1 tablet by mouth  daily as needed. Indigestion       . vitamin B-12 (CYANOCOBALAMIN) 1000 MCG tablet Take 1,500 mcg by mouth daily.      Marland Kitchen zolpidem (AMBIEN) 5 MG tablet Take 2.5 mg by mouth at bedtime as needed.       Marland Kitchen lisinopril (PRINIVIL,ZESTRIL) 20 MG tablet Take 1 tablet (20 mg total) by mouth daily.  90 tablet  3  . Omega-3 Fatty Acids (FISH OIL) 1200 MG CAPS Take 1 capsule by mouth daily.        . pantoprazole (PROTONIX) 40 MG tablet Take 1 tablet (40 mg total) by mouth daily.  30 tablet  11   No current facility-administered medications for this visit.    No Known Allergies  Past Medical History  Diagnosis Date  . Hypertension   . Hyperlipidemia   . Coronary artery disease     s/p CABG in November 2009  . GERD (gastroesophageal reflux disease)   . VZCHYIFO(277.4)     Past Surgical History  Procedure Laterality Date  . Replacement total knee bilateral    . Cholecystectomy    .  Coronary artery bypass graft  November 2009    LIMA to LAD, SVG to DX, SVG to 1st OM and SVG to RCA  . Cardiac catheterization  05/16/2008    EF 70+  . [place] echocardiography  05/12/2008    EF 55-60%  . Cardiovascular stress test  05/11/2008    EF 61%.ABNORMAL STRESS NUCLEAR STUDY. FINDINGS ARE CONSISTENT WITH ANTERIOR ISCHEMIA    History  Smoking status  . Never Smoker   Smokeless tobacco  . Not on file    History  Alcohol Use No    Family History  Problem Relation Age of Onset  . Lung cancer Mother   . Coronary artery disease Father     Review of Systems: The review of systems is per the HPI.  All other systems were reviewed and are negative.  Physical Exam: BP 180/90  Pulse 60  Ht 5\' 2"  (1.575 m)  Wt 121 lb (54.885 kg)  BMI 22.13 kg/m2 Patient is very pleasant and in no acute distress. Looks younger than her stated age. Skin is warm and dry. Color is normal.  HEENT is unremarkable. Normocephalic/atraumatic. PERRL. Sclera are nonicteric. Neck is supple. No masses. No JVD. Lungs are clear. Cardiac  exam shows a regular rate and rhythm. Abdomen is soft. Extremities are without edema. Gait and ROM are intact. No gross neurologic deficits noted.  LABORATORY DATA:  Lab Results  Component Value Date   WBC 5.7 04/22/2011   HGB 10.4* 04/22/2011   HCT 31.4* 04/22/2011   PLT 193.0 04/22/2011   GLUCOSE 96 04/22/2011   ALT 13 05/18/2008   AST 23 05/18/2008   NA 140 04/22/2011   K 4.8 04/22/2011   CL 103 04/22/2011   CREATININE 0.9 04/22/2011   BUN 23 04/22/2011   CO2 27 04/22/2011   INR 1.9* 05/20/2008   HGBA1C  Value: 6.2 (NOTE)   The ADA recommends the following therapeutic goal for glycemic   control related to Hgb A1C measurement:   Goal of Therapy:   < 7.0% Hgb A1C   Reference: American Diabetes Association: Clinical Practice   Recommendations 2008, Diabetes Care,  2008, 31:(Suppl 1).* 05/18/2008    Assessment / Plan:  1. Prior syncope - without recurrence.   2. HTN - blood pressure better at home. Recheck by me is down to 160/60. She will continue to monitor at home.   3. CAD - prior CABG in 2009 - no symptoms reported. Conservative management.   I will see her back in a year.   Patient is agreeable to this plan and will call if any problems develop in the interim.   Rosalio MacadamiaLori C. Oland Arquette, RN, ANP-C [facility] [address] [place], [place]  [id number] [phone]

## 2013-10-12 NOTE — Patient Instructions (Addendum)
Stay on your current medicines  I will see you in one year  Keep a check on your blood pressure  Stay active and safe!!  Call the [facility] office at [phone] if you have any questions, problems or concerns.

## 2013-11-19 ENCOUNTER — Other Ambulatory Visit: Payer: Self-pay | Admitting: Nurse Practitioner

## 2013-12-06 ENCOUNTER — Encounter (HOSPITAL_COMMUNITY): Payer: Self-pay

## 2013-12-06 ENCOUNTER — Encounter (HOSPITAL_COMMUNITY)
Admission: RE | Admit: 2013-12-06 | Discharge: 2013-12-06 | Disposition: A | Discharge status: Home / Self Care | Payer: Medicare HMO | Source: Ambulatory Visit | Attending: Ophthalmology | Admitting: Ophthalmology

## 2013-12-06 DIAGNOSIS — Z01812 Encounter for preprocedural laboratory examination: Secondary | ICD-10-CM | POA: Insufficient documentation

## 2013-12-06 DIAGNOSIS — Z01810 Encounter for preprocedural cardiovascular examination: Secondary | ICD-10-CM | POA: Insufficient documentation

## 2013-12-06 HISTORY — DX: Unspecified osteoarthritis, unspecified site: M19.90

## 2013-12-06 HISTORY — DX: Unspecified hearing loss, unspecified ear: H91.90

## 2013-12-06 LAB — BASIC METABOLIC PANEL
BUN: 21 mg/dL (ref 6–23)
CO2: 27 mEq/L (ref 19–32)
CREATININE: 0.84 mg/dL (ref 0.50–1.10)
Calcium: 10.1 mg/dL (ref 8.4–10.5)
Chloride: 101 mEq/L (ref 96–112)
GFR, EST AFRICAN AMERICAN: 70 mL/min — AB (ref >90)
GFR, EST NON AFRICAN AMERICAN: 61 mL/min — AB (ref >90)
Glucose, Bld: 98 mg/dL (ref 70–99)
Potassium: 4.9 mEq/L (ref 3.7–5.3)
Sodium: 140 mEq/L (ref 137–147)

## 2013-12-06 LAB — HEMOGLOBIN AND HEMATOCRIT, BLOOD
HCT: 36.5 % (ref 36.0–46.0)
Hemoglobin: 11.8 g/dL — ABNORMAL LOW (ref 12.0–15.0)

## 2013-12-06 NOTE — Patient Instructions (Addendum)
Your procedure is scheduled on: 12/13/2013  Report to [facility] at  900  AM.  Call this number if you have problems the morning of surgery: [phone]   Do not eat food or drink liquids :After Midnight.      Take these medicines the morning of surgery with A SIP OF WATER: amlodipine, lisinopril, metoprolol, pantoprazole   Do not wear jewelry, make-up or nail polish.  Do not wear lotions, powders, or perfumes.   Do not shave 48 hours prior to surgery.  Do not bring valuables to the hospital.  Contacts, dentures or bridgework may not be worn into surgery.  Leave suitcase in the car. After surgery it may be brought to your room.  For patients admitted to the hospital, checkout time is 11:00 AM the day of discharge.   Patients discharged the day of surgery will not be allowed to drive home.  :     Please read over the following fact sheets that you were given: Coughing and Deep Breathing, Surgical Site Infection Prevention, Anesthesia Post-op Instructions and Care and Recovery After Surgery    Cataract A cataract is a clouding of the lens of the eye. When a lens becomes cloudy, vision is reduced based on the degree and nature of the clouding. Many cataracts reduce vision to some degree. Some cataracts make people more near-sighted as they develop. Other cataracts increase glare. Cataracts that are ignored and become worse can sometimes look white. The white color can be seen through the pupil. CAUSES   Aging. However, cataracts may occur at any age, even in newborns.   Certain drugs.   Trauma to the eye.   Certain diseases such as diabetes.   Specific eye diseases such as chronic inflammation inside the eye or a sudden attack of a rare form of glaucoma.   Inherited or acquired medical problems.  SYMPTOMS   Gradual, progressive drop in vision in the affected eye.   Severe, rapid visual loss. This most often happens when trauma is the cause.  DIAGNOSIS  To detect a cataract, an  eye doctor examines the lens. Cataracts are best diagnosed with an exam of the eyes with the pupils enlarged (dilated) by drops.  TREATMENT  For an early cataract, vision may improve by using different eyeglasses or stronger lighting. If that does not help your vision, surgery is the only effective treatment. A cataract needs to be surgically removed when vision loss interferes with your everyday activities, such as driving, reading, or watching TV. A cataract may also have to be removed if it prevents examination or treatment of another eye problem. Surgery removes the cloudy lens and usually replaces it with a substitute lens (intraocular lens, IOL).  At a time when both you and your doctor agree, the cataract will be surgically removed. If you have cataracts in both eyes, only one is usually removed at a time. This allows the operated eye to heal and be out of danger from any possible problems after surgery (such as infection or poor wound healing). In rare cases, a cataract may be doing damage to your eye. In these cases, your caregiver may advise surgical removal right away. The vast majority of people who have cataract surgery have better vision afterward. HOME CARE INSTRUCTIONS  If you are not planning surgery, you may be asked to do the following:  Use different eyeglasses.   Use stronger or brighter lighting.   Ask your eye doctor about reducing your medicine dose  or changing medicines if it is thought that a medicine caused your cataract. Changing medicines does not make the cataract go away on its own.   Become familiar with your surroundings. Poor vision can lead to injury. Avoid bumping into things on the affected side. You are at a higher risk for tripping or falling.   Exercise extreme care when driving or operating machinery.   Wear sunglasses if you are sensitive to bright light or experiencing problems with glare.  SEEK IMMEDIATE MEDICAL CARE IF:   You have a worsening or sudden  vision loss.   You notice redness, swelling, or increasing pain in the eye.   You have a fever.  Document Released: 06/24/2005 Document Revised: 06/13/2011 Document Reviewed: 02/15/2011 [facility] Patient Information 2012 [place].PATIENT INSTRUCTIONS POST-ANESTHESIA  IMMEDIATELY FOLLOWING SURGERY:  Do not drive or operate machinery for the first twenty four hours after surgery.  Do not make any important decisions for twenty four hours after surgery or while taking narcotic pain medications or sedatives.  If you develop intractable nausea and vomiting or a severe headache please notify your doctor immediately.  FOLLOW-UP:  Please make an appointment with your surgeon as instructed. You do not need to follow up with anesthesia unless specifically instructed to do so.  WOUND CARE INSTRUCTIONS (if applicable):  Keep a dry clean dressing on the anesthesia/puncture wound site if there is drainage.  Once the wound has quit draining you may leave it open to air.  Generally you should leave the bandage intact for twenty four hours unless there is drainage.  If the epidural site drains for more than 36-48 hours please call the anesthesia department.  QUESTIONS?:  Please feel free to call your physician or the hospital operator if you have any questions, and they will be happy to assist you.

## 2013-12-07 ENCOUNTER — Encounter (HOSPITAL_COMMUNITY): Payer: Self-pay | Admitting: Pharmacy Technician

## 2013-12-13 ENCOUNTER — Encounter (HOSPITAL_COMMUNITY): Payer: Medicare HMO | Admitting: Anesthesiology

## 2013-12-13 ENCOUNTER — Encounter (HOSPITAL_COMMUNITY): Payer: Self-pay | Admitting: *Deleted

## 2013-12-13 ENCOUNTER — Encounter (HOSPITAL_COMMUNITY)
Admission: RE | Disposition: A | Discharge status: Home / Self Care | Payer: Self-pay | Source: Ambulatory Visit | Attending: Ophthalmology

## 2013-12-13 ENCOUNTER — Ambulatory Visit (HOSPITAL_COMMUNITY): Payer: Medicare HMO | Admitting: Anesthesiology

## 2013-12-13 ENCOUNTER — Ambulatory Visit (HOSPITAL_COMMUNITY)
Admission: RE | Admit: 2013-12-13 | Discharge: 2013-12-13 | Disposition: A | Discharge status: Home / Self Care | Payer: Medicare HMO | Source: Ambulatory Visit | Attending: Ophthalmology | Admitting: Ophthalmology

## 2013-12-13 DIAGNOSIS — Z951 Presence of aortocoronary bypass graft: Secondary | ICD-10-CM | POA: Insufficient documentation

## 2013-12-13 DIAGNOSIS — H2589 Other age-related cataract: Secondary | ICD-10-CM | POA: Insufficient documentation

## 2013-12-13 DIAGNOSIS — I10 Essential (primary) hypertension: Secondary | ICD-10-CM | POA: Insufficient documentation

## 2013-12-13 DIAGNOSIS — K219 Gastro-esophageal reflux disease without esophagitis: Secondary | ICD-10-CM | POA: Insufficient documentation

## 2013-12-13 DIAGNOSIS — I2510 Atherosclerotic heart disease of native coronary artery without angina pectoris: Secondary | ICD-10-CM | POA: Insufficient documentation

## 2013-12-13 HISTORY — PX: CATARACT EXTRACTION W/PHACO: SHX586

## 2013-12-13 SURGERY — PHACOEMULSIFICATION, CATARACT, WITH IOL INSERTION
Anesthesia: Monitor Anesthesia Care | Site: Eye | Laterality: Right

## 2013-12-13 MED ORDER — POVIDONE-IODINE 5 % OP SOLN
OPHTHALMIC | Status: DC | PRN
  Administered 2013-12-13: 1 via OPHTHALMIC

## 2013-12-13 MED ORDER — FENTANYL CITRATE 0.05 MG/ML IJ SOLN
INTRAMUSCULAR | Status: AC
  Filled 2013-12-13: qty 2

## 2013-12-13 MED ORDER — ONDANSETRON HCL 4 MG/2ML IJ SOLN: 4.0000 mg | Freq: Once | INTRAMUSCULAR | Status: DC | PRN

## 2013-12-13 MED ORDER — CYCLOPENTOLATE-PHENYLEPHRINE 0.2-1 % OP SOLN
1.0000 [drp] | OPHTHALMIC | Status: AC
  Administered 2013-12-13 (×3): 1 [drp] via OPHTHALMIC

## 2013-12-13 MED ORDER — NEOMYCIN-POLYMYXIN-DEXAMETH 3.5-10000-0.1 OP SUSP
OPHTHALMIC | Status: DC | PRN
  Administered 2013-12-13: 2 [drp] via OPHTHALMIC

## 2013-12-13 MED ORDER — PHENYLEPHRINE HCL 2.5 % OP SOLN
1.0000 [drp] | OPHTHALMIC | Status: AC
  Administered 2013-12-13 (×3): 1 [drp] via OPHTHALMIC

## 2013-12-13 MED ORDER — EPINEPHRINE HCL 1 MG/ML IJ SOLN
INTRAMUSCULAR | Status: DC | PRN
  Administered 2013-12-13: 10:00:00

## 2013-12-13 MED ORDER — BSS IO SOLN
INTRAOCULAR | Status: DC | PRN
  Administered 2013-12-13: 15 mL via INTRAOCULAR

## 2013-12-13 MED ORDER — MIDAZOLAM HCL 2 MG/2ML IJ SOLN
INTRAMUSCULAR | Status: AC
  Filled 2013-12-13: qty 2

## 2013-12-13 MED ORDER — TETRACAINE HCL 0.5 % OP SOLN
1.0000 [drp] | OPHTHALMIC | Status: AC
  Administered 2013-12-13 (×3): 1 [drp] via OPHTHALMIC

## 2013-12-13 MED ORDER — LIDOCAINE HCL (PF) 1 % IJ SOLN
INTRAMUSCULAR | Status: DC | PRN
  Administered 2013-12-13: .5 mL

## 2013-12-13 MED ORDER — LIDOCAINE HCL 3.5 % OP GEL
1.0000 "application " | Freq: Once | OPHTHALMIC | Status: AC
  Administered 2013-12-13: 1 via OPHTHALMIC

## 2013-12-13 MED ORDER — FENTANYL CITRATE 0.05 MG/ML IJ SOLN
25.0000 ug | INTRAMUSCULAR | Status: AC
  Administered 2013-12-13 (×2): 25 ug via INTRAVENOUS

## 2013-12-13 MED ORDER — MIDAZOLAM HCL 2 MG/2ML IJ SOLN
1.0000 mg | INTRAMUSCULAR | Status: DC | PRN
  Administered 2013-12-13: 2 mg via INTRAVENOUS

## 2013-12-13 MED ORDER — FENTANYL CITRATE 0.05 MG/ML IJ SOLN: 25.0000 ug | INTRAMUSCULAR | Status: DC | PRN

## 2013-12-13 MED ORDER — LIDOCAINE 3.5 % OP GEL OPTIME - NO CHARGE
OPHTHALMIC | Status: DC | PRN
Start: 2013-12-13 — End: 2013-12-13
  Administered 2013-12-13: 2 [drp] via OPHTHALMIC

## 2013-12-13 MED ORDER — EPINEPHRINE HCL 1 MG/ML IJ SOLN
INTRAMUSCULAR | Status: AC
  Filled 2013-12-13: qty 1

## 2013-12-13 MED ORDER — LACTATED RINGERS IV SOLN
INTRAVENOUS | Status: DC
  Administered 2013-12-13: 10:00:00 via INTRAVENOUS

## 2013-12-13 MED ORDER — NA HYALUR & NA CHOND-NA HYALUR 0.55-0.5 ML IO KIT
PACK | INTRAOCULAR | Status: DC | PRN
  Administered 2013-12-13: 1 via OPHTHALMIC

## 2013-12-13 SURGICAL SUPPLY — 34 items
CAPSULAR TENSION RING-AMO (OPHTHALMIC RELATED) IMPLANT
CLOTH BEACON ORANGE TIMEOUT ST (SAFETY) ×2 IMPLANT
EYE SHIELD UNIVERSAL CLEAR (GAUZE/BANDAGES/DRESSINGS) ×2 IMPLANT
GLOVE BIO SURGEON STRL SZ 6.5 (GLOVE) IMPLANT
GLOVE BIO SURGEONS STRL SZ 6.5 (GLOVE)
GLOVE BIOGEL PI IND STRL 6.5 (GLOVE) IMPLANT
GLOVE BIOGEL PI IND STRL 7.0 (GLOVE) IMPLANT
GLOVE BIOGEL PI IND STRL 7.5 (GLOVE) IMPLANT
GLOVE BIOGEL PI INDICATOR 6.5 (GLOVE)
GLOVE BIOGEL PI INDICATOR 7.0 (GLOVE)
GLOVE BIOGEL PI INDICATOR 7.5 (GLOVE)
GLOVE ECLIPSE 6.5 STRL STRAW (GLOVE) IMPLANT
GLOVE ECLIPSE 7.0 STRL STRAW (GLOVE) IMPLANT
GLOVE ECLIPSE 7.5 STRL STRAW (GLOVE) IMPLANT
GLOVE EXAM NITRILE LRG STRL (GLOVE) IMPLANT
GLOVE EXAM NITRILE MD LF STRL (GLOVE) IMPLANT
GLOVE SKINSENSE NS SZ6.5 (GLOVE)
GLOVE SKINSENSE NS SZ7.0 (GLOVE)
GLOVE SKINSENSE STRL SZ6.5 (GLOVE) IMPLANT
GLOVE SKINSENSE STRL SZ7.0 (GLOVE) IMPLANT
GLOVE SS N UNI LF 7.0 STRL (GLOVE) ×4 IMPLANT
KIT VITRECTOMY (OPHTHALMIC RELATED) IMPLANT
PAD ARMBOARD 7.5X6 YLW CONV (MISCELLANEOUS) ×2 IMPLANT
PROC W NO LENS (INTRAOCULAR LENS)
PROC W SPEC LENS (INTRAOCULAR LENS)
PROCESS W NO LENS (INTRAOCULAR LENS) IMPLANT
PROCESS W SPEC LENS (INTRAOCULAR LENS) IMPLANT
RING MALYGIN (MISCELLANEOUS) IMPLANT
SIGHTPATH CAT PROC W REG LENS (Ophthalmic Related) ×3 IMPLANT
SYR TB 1ML LL NO SAFETY (SYRINGE) ×2 IMPLANT
TAPE SURG TRANSPORE 1 IN (GAUZE/BANDAGES/DRESSINGS) IMPLANT
TAPE SURGICAL TRANSPORE 1 IN (GAUZE/BANDAGES/DRESSINGS) ×2
VISCOELASTIC ADDITIONAL (OPHTHALMIC RELATED) IMPLANT
WATER STERILE IRR 250ML POUR (IV SOLUTION) ×2 IMPLANT

## 2013-12-13 NOTE — Anesthesia Postprocedure Evaluation (Signed)
  Anesthesia Post-op Note  Patient: Laura Woodard  Procedure(s) Performed: Procedure(s) with comments: CATARACT EXTRACTION PHACO AND INTRAOCULAR LENS PLACEMENT (IOC) (Right) - CDE 7.89  Patient Location: Short Stay  Anesthesia Type:MAC  Level of Consciousness: awake, alert , oriented and patient cooperative  Airway and Oxygen Therapy: Patient Spontanous Breathing  Post-op Pain: none  Post-op Assessment: Post-op Vital signs reviewed, Patient's Cardiovascular Status Stable, Respiratory Function Stable and Pain level controlled  Post-op Vital Signs: Reviewed and stable  Last Vitals:  Filed Vitals:   12/13/13 1000  BP: 147/70  Temp:   Resp: 16    Complications: No apparent anesthesia complications

## 2013-12-13 NOTE — Discharge Instructions (Signed)

## 2013-12-13 NOTE — H&P (Signed)
I have reviewed the H&P, the patient was re-examined, and I have identified no interval changes in medical condition and plan of care since the history and physical of record  

## 2013-12-13 NOTE — Progress Notes (Signed)
Dr. Jayme Cloud made aware of pt's BP. Pt is asymptomatic. Ok to proceed with surgery per Dr. Jayme Cloud

## 2013-12-13 NOTE — Anesthesia Preprocedure Evaluation (Signed)
Anesthesia Evaluation  Patient identified by MRN, date of birth, ID band Patient awake    Reviewed: Allergy & Precautions, H&P , NPO status , Patient's Chart, lab work & pertinent test results, reviewed documented beta blocker date and time   Airway Mallampati: II TM Distance: >3 FB Neck ROM: Full    Dental  (+) Edentulous Upper, Edentulous Lower   Pulmonary neg pulmonary ROS,  breath sounds clear to auscultation        Cardiovascular hypertension, Pt. on medications and Pt. on home beta blockers - angina+ CAD and + CABG Rhythm:Regular Rate:Normal     Neuro/Psych  Headaches,    GI/Hepatic GERD-  Medicated and Controlled,  Endo/Other    Renal/GU      Musculoskeletal   Abdominal   Peds  Hematology   Anesthesia Other Findings   Reproductive/Obstetrics                           Anesthesia Physical Anesthesia Plan  ASA: III  Anesthesia Plan: MAC   Post-op Pain Management:    Induction: Intravenous  Airway Management Planned: Nasal Cannula  Additional Equipment:   Intra-op Plan:   Post-operative Plan:   Informed Consent: I have reviewed the patients History and Physical, chart, labs and discussed the procedure including the risks, benefits and alternatives for the proposed anesthesia with the patient or authorized representative who has indicated his/her understanding and acceptance.     Plan Discussed with:   Anesthesia Plan Comments:         Anesthesia Quick Evaluation  

## 2013-12-13 NOTE — Transfer of Care (Signed)
Immediate Anesthesia Transfer of Care Note  Patient: Laura Woodard  Procedure(s) Performed: Procedure(s) with comments: CATARACT EXTRACTION PHACO AND INTRAOCULAR LENS PLACEMENT (IOC) (Right) - CDE 7.89  Patient Location: Short Stay  Anesthesia Type:MAC  Level of Consciousness: awake, alert , oriented and patient cooperative  Airway & Oxygen Therapy: Patient Spontanous Breathing  Post-op Assessment: Report given to PACU RN, Post -op Vital signs reviewed and stable and Patient moving all extremities  Post vital signs: Reviewed and stable  Complications: No apparent anesthesia complications

## 2013-12-13 NOTE — Op Note (Signed)
Date of Admission: 12/13/2013  Date of Surgery: 12/13/2013   Pre-Op Dx: Cataract Right Eye  Post-Op Dx: Combined Cataract Right  Eye,  Dx Code 366.19  Surgeon: Gemma Payor, M.D.  Assistants: None  Anesthesia: Topical with MAC  Indications: Painless, progressive loss of vision with compromise of daily activities.  Surgery: Cataract Extraction with Intraocular lens Implant Right Eye  Discription: The patient had dilating drops and viscous lidocaine placed into the Right eye in the pre-op holding area. After transfer to the operating room, a time out was performed. The patient was then prepped and draped. Beginning with a 75 degree blade a paracentesis port was made at the surgeon's 2 o'clock position. The anterior chamber was then filled with 1% non-preserved lidocaine. This was followed by filling the anterior chamber with Provisc.  A 2.23mm keratome blade was used to make a clear corneal incision at the temporal limbus.  A bent cystatome needle was used to create a continuous tear capsulotomy. Hydrodissection was performed with balanced salt solution on a Fine canula. The lens nucleus was then removed using the phacoemulsification handpiece. Residual cortex was removed with the I&A handpiece. The anterior chamber and capsular bag were refilled with Provisc. A posterior chamber intraocular lens was placed into the capsular bag with it's injector. The implant was positioned with the Kuglan hook. The Provisc was then removed from the anterior chamber and capsular bag with the I&A handpiece. Stromal hydration of the main incision and paracentesis port was performed with BSS on a Fine canula. The wounds were tested for leak which was negative. The patient tolerated the procedure well. There were no operative complications. The patient was then transferred to the recovery room in stable condition.  Complications: None  Specimen: None  EBL: None  Prosthetic device: Hoya iSert 250, power 17.5 D, SN  [id number].

## 2013-12-14 ENCOUNTER — Encounter (HOSPITAL_COMMUNITY): Payer: Self-pay | Admitting: Ophthalmology

## 2013-12-21 ENCOUNTER — Encounter (HOSPITAL_COMMUNITY): Payer: Self-pay | Admitting: Pharmacy Technician

## 2013-12-31 ENCOUNTER — Encounter (HOSPITAL_COMMUNITY)
Admission: RE | Admit: 2013-12-31 | Discharge: 2013-12-31 | Disposition: A | Discharge status: Home / Self Care | Payer: Medicare HMO | Source: Ambulatory Visit | Attending: Ophthalmology | Admitting: Ophthalmology

## 2013-12-31 ENCOUNTER — Encounter (HOSPITAL_COMMUNITY): Payer: Self-pay

## 2014-01-05 MED ORDER — LIDOCAINE HCL (PF) 1 % IJ SOLN
INTRAMUSCULAR | Status: AC
  Filled 2014-01-05: qty 2

## 2014-01-05 MED ORDER — NEOMYCIN-POLYMYXIN-DEXAMETH 3.5-10000-0.1 OP SUSP
OPHTHALMIC | Status: AC
  Filled 2014-01-05: qty 5

## 2014-01-05 MED ORDER — CYCLOPENTOLATE-PHENYLEPHRINE OP SOLN OPTIME - NO CHARGE
OPHTHALMIC | Status: AC
  Filled 2014-01-05: qty 2

## 2014-01-05 MED ORDER — PHENYLEPHRINE HCL 2.5 % OP SOLN
OPHTHALMIC | Status: AC
  Filled 2014-01-05: qty 15

## 2014-01-05 MED ORDER — TETRACAINE HCL 0.5 % OP SOLN
OPHTHALMIC | Status: AC
  Filled 2014-01-05: qty 2

## 2014-01-05 MED ORDER — LIDOCAINE HCL 3.5 % OP GEL
OPHTHALMIC | Status: AC
  Filled 2014-01-05: qty 1

## 2014-01-06 ENCOUNTER — Ambulatory Visit (HOSPITAL_COMMUNITY)
Admission: RE | Admit: 2014-01-06 | Discharge: 2014-01-06 | Disposition: A | Discharge status: Home / Self Care | Payer: Medicare HMO | Source: Ambulatory Visit | Attending: Ophthalmology | Admitting: Ophthalmology

## 2014-01-06 ENCOUNTER — Encounter (HOSPITAL_COMMUNITY): Payer: Medicare HMO | Admitting: Anesthesiology

## 2014-01-06 ENCOUNTER — Encounter (HOSPITAL_COMMUNITY): Payer: Self-pay | Admitting: *Deleted

## 2014-01-06 ENCOUNTER — Encounter (HOSPITAL_COMMUNITY)
Admission: RE | Disposition: A | Discharge status: Home / Self Care | Payer: Self-pay | Source: Ambulatory Visit | Attending: Ophthalmology

## 2014-01-06 ENCOUNTER — Ambulatory Visit (HOSPITAL_COMMUNITY): Payer: Medicare HMO | Admitting: Anesthesiology

## 2014-01-06 DIAGNOSIS — Z79899 Other long term (current) drug therapy: Secondary | ICD-10-CM | POA: Insufficient documentation

## 2014-01-06 DIAGNOSIS — Z951 Presence of aortocoronary bypass graft: Secondary | ICD-10-CM | POA: Insufficient documentation

## 2014-01-06 DIAGNOSIS — Z7982 Long term (current) use of aspirin: Secondary | ICD-10-CM | POA: Insufficient documentation

## 2014-01-06 DIAGNOSIS — H269 Unspecified cataract: Secondary | ICD-10-CM | POA: Insufficient documentation

## 2014-01-06 DIAGNOSIS — I10 Essential (primary) hypertension: Secondary | ICD-10-CM | POA: Insufficient documentation

## 2014-01-06 DIAGNOSIS — K219 Gastro-esophageal reflux disease without esophagitis: Secondary | ICD-10-CM | POA: Insufficient documentation

## 2014-01-06 DIAGNOSIS — I2510 Atherosclerotic heart disease of native coronary artery without angina pectoris: Secondary | ICD-10-CM | POA: Insufficient documentation

## 2014-01-06 HISTORY — PX: CATARACT EXTRACTION W/PHACO: SHX586

## 2014-01-06 SURGERY — PHACOEMULSIFICATION, CATARACT, WITH IOL INSERTION
Anesthesia: Monitor Anesthesia Care | Site: Eye | Laterality: Left

## 2014-01-06 MED ORDER — MIDAZOLAM HCL 2 MG/2ML IJ SOLN
INTRAMUSCULAR | Status: AC
  Filled 2014-01-06: qty 2

## 2014-01-06 MED ORDER — MIDAZOLAM HCL 2 MG/2ML IJ SOLN
1.0000 mg | INTRAMUSCULAR | Status: DC | PRN
  Administered 2014-01-06: 2 mg via INTRAVENOUS

## 2014-01-06 MED ORDER — BSS IO SOLN
INTRAOCULAR | Status: DC | PRN
  Administered 2014-01-06: 15 mL via INTRAOCULAR

## 2014-01-06 MED ORDER — LACTATED RINGERS IV SOLN
INTRAVENOUS | Status: DC
  Administered 2014-01-06: 1000 mL via INTRAVENOUS

## 2014-01-06 MED ORDER — LIDOCAINE HCL 3.5 % OP GEL
1.0000 "application " | Freq: Once | OPHTHALMIC | Status: AC
  Administered 2014-01-06: 1 via OPHTHALMIC

## 2014-01-06 MED ORDER — POVIDONE-IODINE 5 % OP SOLN
OPHTHALMIC | Status: DC | PRN
  Administered 2014-01-06: 1 via OPHTHALMIC

## 2014-01-06 MED ORDER — PHENYLEPHRINE HCL 2.5 % OP SOLN
1.0000 [drp] | OPHTHALMIC | Status: AC
  Administered 2014-01-06 (×3): 1 [drp] via OPHTHALMIC

## 2014-01-06 MED ORDER — NA HYALUR & NA CHOND-NA HYALUR 0.55-0.5 ML IO KIT
PACK | INTRAOCULAR | Status: DC | PRN
  Administered 2014-01-06: 1 via OPHTHALMIC

## 2014-01-06 MED ORDER — FENTANYL CITRATE 0.05 MG/ML IJ SOLN
25.0000 ug | INTRAMUSCULAR | Status: AC
  Administered 2014-01-06 (×2): 25 ug via INTRAVENOUS

## 2014-01-06 MED ORDER — EPINEPHRINE HCL 1 MG/ML IJ SOLN
INTRAMUSCULAR | Status: AC
  Filled 2014-01-06: qty 1

## 2014-01-06 MED ORDER — TETRACAINE HCL 0.5 % OP SOLN
1.0000 [drp] | OPHTHALMIC | Status: AC
  Administered 2014-01-06 (×3): 1 [drp] via OPHTHALMIC

## 2014-01-06 MED ORDER — PROVISC 10 MG/ML IO SOLN: INTRAOCULAR | Status: DC | PRN

## 2014-01-06 MED ORDER — NEOMYCIN-POLYMYXIN-DEXAMETH 3.5-10000-0.1 OP SUSP
OPHTHALMIC | Status: DC | PRN
Start: 2014-01-06 — End: 2014-01-06
  Administered 2014-01-06: 2 [drp] via OPHTHALMIC

## 2014-01-06 MED ORDER — EPINEPHRINE HCL 1 MG/ML IJ SOLN
INTRAOCULAR | Status: DC | PRN
  Administered 2014-01-06: 11:00:00

## 2014-01-06 MED ORDER — LIDOCAINE HCL (PF) 1 % IJ SOLN
INTRAMUSCULAR | Status: DC | PRN
  Administered 2014-01-06: .5 mL

## 2014-01-06 MED ORDER — CYCLOPENTOLATE-PHENYLEPHRINE 0.2-1 % OP SOLN
1.0000 [drp] | OPHTHALMIC | Status: AC
  Administered 2014-01-06 (×3): 1 [drp] via OPHTHALMIC

## 2014-01-06 MED ORDER — FENTANYL CITRATE 0.05 MG/ML IJ SOLN
INTRAMUSCULAR | Status: AC
  Filled 2014-01-06: qty 2

## 2014-01-06 SURGICAL SUPPLY — 35 items
CAPSULAR TENSION RING-AMO (OPHTHALMIC RELATED) IMPLANT
CLOTH BEACON ORANGE TIMEOUT ST (SAFETY) ×2 IMPLANT
EYE SHIELD UNIVERSAL CLEAR (GAUZE/BANDAGES/DRESSINGS) ×2 IMPLANT
GLOVE BIO SURGEON STRL SZ 6.5 (GLOVE) IMPLANT
GLOVE BIO SURGEONS STRL SZ 6.5 (GLOVE)
GLOVE BIOGEL PI IND STRL 6.5 (GLOVE) IMPLANT
GLOVE BIOGEL PI IND STRL 7.0 (GLOVE) IMPLANT
GLOVE BIOGEL PI IND STRL 7.5 (GLOVE) IMPLANT
GLOVE BIOGEL PI IND STRL 8.5 (GLOVE) IMPLANT
GLOVE BIOGEL PI INDICATOR 6.5 (GLOVE) ×2
GLOVE BIOGEL PI INDICATOR 7.0 (GLOVE) ×2
GLOVE BIOGEL PI INDICATOR 7.5 (GLOVE)
GLOVE BIOGEL PI INDICATOR 8.5 (GLOVE) ×2
GLOVE ECLIPSE 6.5 STRL STRAW (GLOVE) IMPLANT
GLOVE ECLIPSE 7.0 STRL STRAW (GLOVE) IMPLANT
GLOVE ECLIPSE 7.5 STRL STRAW (GLOVE) IMPLANT
GLOVE EXAM NITRILE LRG STRL (GLOVE) IMPLANT
GLOVE EXAM NITRILE MD LF STRL (GLOVE) IMPLANT
GLOVE SKINSENSE NS SZ6.5 (GLOVE)
GLOVE SKINSENSE NS SZ7.0 (GLOVE)
GLOVE SKINSENSE STRL SZ6.5 (GLOVE) IMPLANT
GLOVE SKINSENSE STRL SZ7.0 (GLOVE) IMPLANT
KIT VITRECTOMY (OPHTHALMIC RELATED) IMPLANT
PAD ARMBOARD 7.5X6 YLW CONV (MISCELLANEOUS) ×2 IMPLANT
PROC W NO LENS (INTRAOCULAR LENS)
PROC W SPEC LENS (INTRAOCULAR LENS)
PROCESS W NO LENS (INTRAOCULAR LENS) IMPLANT
PROCESS W SPEC LENS (INTRAOCULAR LENS) IMPLANT
RING MALYGIN (MISCELLANEOUS) IMPLANT
SIGHTPATH CAT PROC W REG LENS (Ophthalmic Related) ×3 IMPLANT
SYR TB 1ML LL NO SAFETY (SYRINGE) ×2 IMPLANT
TAPE SURG TRANSPORE 1 IN (GAUZE/BANDAGES/DRESSINGS) IMPLANT
TAPE SURGICAL TRANSPORE 1 IN (GAUZE/BANDAGES/DRESSINGS) ×2
VISCOELASTIC ADDITIONAL (OPHTHALMIC RELATED) IMPLANT
WATER STERILE IRR 250ML POUR (IV SOLUTION) ×2 IMPLANT

## 2014-01-06 NOTE — Anesthesia Postprocedure Evaluation (Signed)
  Anesthesia Post-op Note  Patient: Laura CrazeJuanita J Kumari  Procedure(s) Performed: Procedure(s) with comments: CATARACT EXTRACTION PHACO AND INTRAOCULAR LENS PLACEMENT (IOC) (Left) - CDE:9.23  Patient Location: Short Stay  Anesthesia Type:MAC  Level of Consciousness: awake, alert  and oriented  Airway and Oxygen Therapy: Patient Spontanous Breathing  Post-op Pain: none  Post-op Assessment: Post-op Vital signs reviewed, Patient's Cardiovascular Status Stable, Respiratory Function Stable, Patent Airway and No signs of Nausea or vomiting  Post-op Vital Signs: Reviewed  Last Vitals:  Filed Vitals:   01/06/14 0950  BP: 141/75  Pulse: 69  Temp:   Resp: 16    Complications: No apparent anesthesia complications

## 2014-01-06 NOTE — Op Note (Signed)
Date of Admission: 01/06/2014  Date of Surgery: 01/06/2014   Pre-Op Dx: Cataract Left Eye  Post-Op Dx: Cataract Left  Eye,  Dx Code 366.19  Surgeon: Gemma PayorKerry Eula Jaster, M.D.  Assistants: None  Anesthesia: Topical with MAC  Indications: Painless, progressive loss of vision with compromise of daily activities.  Surgery: Cataract Extraction with Intraocular lens Implant Left Eye  Discription: The patient had dilating drops and viscous lidocaine placed into the Left eye in the pre-op holding area. After transfer to the operating room, a time out was performed. The patient was then prepped and draped. Beginning with a 75 degree blade a paracentesis port was made at the surgeon's 2 o'clock position. The anterior chamber was then filled with 1% non-preserved lidocaine. This was followed by filling the anterior chamber with Provisc.  A 2.554mm keratome blade was used to make a clear corneal incision at the temporal limbus.  A bent cystatome needle was used to create a continuous tear capsulotomy. Hydrodissection was performed with balanced salt solution on a Fine canula. The lens nucleus was then removed using the phacoemulsification handpiece. Residual cortex was removed with the I&A handpiece. The anterior chamber and capsular bag were refilled with Provisc. A posterior chamber intraocular lens was placed into the capsular bag with it's injector. The implant was positioned with the Kuglan hook. The Provisc was then removed from the anterior chamber and capsular bag with the I&A handpiece. Stromal hydration of the main incision and paracentesis port was performed with BSS on a Fine canula. The wounds were tested for leak which was negative. The patient tolerated the procedure well. There were no operative complications. The patient was then transferred to the recovery room in stable condition.  Complications: None  Specimen: None  EBL: None  Prosthetic device: Hoya iSert 250, power 19.5 D, SN [id number].

## 2014-01-06 NOTE — Anesthesia Preprocedure Evaluation (Signed)
Anesthesia Evaluation  Patient identified by MRN, date of birth, ID band Patient awake    Reviewed: Allergy & Precautions, H&P , NPO status , Patient's Chart, lab work & pertinent test results, reviewed documented beta blocker date and time   Airway Mallampati: II TM Distance: >3 FB Neck ROM: Full    Dental  (+) Edentulous Upper, Edentulous Lower   Pulmonary neg pulmonary ROS,  breath sounds clear to auscultation        Cardiovascular hypertension, Pt. on medications and Pt. on home beta blockers - angina+ CAD and + CABG Rhythm:Regular Rate:Normal     Neuro/Psych  Headaches,    GI/Hepatic GERD-  Medicated and Controlled,  Endo/Other    Renal/GU      Musculoskeletal   Abdominal   Peds  Hematology   Anesthesia Other Findings   Reproductive/Obstetrics                           Anesthesia Physical Anesthesia Plan  ASA: III  Anesthesia Plan: MAC   Post-op Pain Management:    Induction: Intravenous  Airway Management Planned: Nasal Cannula  Additional Equipment:   Intra-op Plan:   Post-operative Plan:   Informed Consent: I have reviewed the patients History and Physical, chart, labs and discussed the procedure including the risks, benefits and alternatives for the proposed anesthesia with the patient or authorized representative who has indicated his/her understanding and acceptance.     Plan Discussed with:   Anesthesia Plan Comments:         Anesthesia Quick Evaluation

## 2014-01-06 NOTE — Transfer of Care (Signed)
Immediate Anesthesia Transfer of Care Note  Patient: Laura CrazeJuanita J Woodard  Procedure(s) Performed: Procedure(s) with comments: CATARACT EXTRACTION PHACO AND INTRAOCULAR LENS PLACEMENT (IOC) (Left) - CDE:9.23  Patient Location: Short Stay  Anesthesia Type:MAC  Level of Consciousness: awake  Airway & Oxygen Therapy: Patient Spontanous Breathing  Post-op Assessment: Report given to PACU RN  Post vital signs: Reviewed  Complications: No apparent anesthesia complications

## 2014-01-06 NOTE — Discharge Instructions (Signed)

## 2014-01-06 NOTE — H&P (Signed)
I have reviewed the H&P, the patient was re-examined, and I have identified no interval changes in medical condition and plan of care since the history and physical of record  

## 2014-01-10 ENCOUNTER — Encounter (HOSPITAL_COMMUNITY): Payer: Self-pay | Admitting: Ophthalmology

## 2014-03-05 ENCOUNTER — Emergency Department (HOSPITAL_COMMUNITY): Payer: Medicare HMO

## 2014-03-05 ENCOUNTER — Emergency Department (HOSPITAL_COMMUNITY)
Admission: EM | Admit: 2014-03-05 | Discharge: 2014-03-05 | Disposition: A | Discharge status: Home / Self Care | Payer: Medicare HMO | Attending: Emergency Medicine | Admitting: Emergency Medicine

## 2014-03-05 ENCOUNTER — Encounter (HOSPITAL_COMMUNITY): Payer: Self-pay | Admitting: Emergency Medicine

## 2014-03-05 DIAGNOSIS — M25559 Pain in unspecified hip: Secondary | ICD-10-CM | POA: Diagnosis present

## 2014-03-05 DIAGNOSIS — I10 Essential (primary) hypertension: Secondary | ICD-10-CM | POA: Insufficient documentation

## 2014-03-05 DIAGNOSIS — Z7982 Long term (current) use of aspirin: Secondary | ICD-10-CM | POA: Insufficient documentation

## 2014-03-05 DIAGNOSIS — Z79899 Other long term (current) drug therapy: Secondary | ICD-10-CM | POA: Diagnosis not present

## 2014-03-05 DIAGNOSIS — E785 Hyperlipidemia, unspecified: Secondary | ICD-10-CM | POA: Diagnosis not present

## 2014-03-05 DIAGNOSIS — K219 Gastro-esophageal reflux disease without esophagitis: Secondary | ICD-10-CM | POA: Insufficient documentation

## 2014-03-05 DIAGNOSIS — M129 Arthropathy, unspecified: Secondary | ICD-10-CM | POA: Insufficient documentation

## 2014-03-05 DIAGNOSIS — I2510 Atherosclerotic heart disease of native coronary artery without angina pectoris: Secondary | ICD-10-CM | POA: Diagnosis not present

## 2014-03-05 DIAGNOSIS — Z9889 Other specified postprocedural states: Secondary | ICD-10-CM | POA: Insufficient documentation

## 2014-03-05 DIAGNOSIS — Z951 Presence of aortocoronary bypass graft: Secondary | ICD-10-CM | POA: Diagnosis not present

## 2014-03-05 DIAGNOSIS — M25552 Pain in left hip: Secondary | ICD-10-CM

## 2014-03-05 LAB — CBC WITH DIFFERENTIAL/PLATELET
BASOS PCT: 0 % (ref 0–1)
Basophils Absolute: 0 10*3/uL (ref 0.0–0.1)
Eosinophils Absolute: 0.1 10*3/uL (ref 0.0–0.7)
Eosinophils Relative: 1 % (ref 0–5)
HEMATOCRIT: 35.8 % — AB (ref 36.0–46.0)
HEMOGLOBIN: 11.6 g/dL — AB (ref 12.0–15.0)
Lymphocytes Relative: 19 % (ref 12–46)
Lymphs Abs: 1.5 10*3/uL (ref 0.7–4.0)
MCH: 29.6 pg (ref 26.0–34.0)
MCHC: 32.4 g/dL (ref 30.0–36.0)
MCV: 91.3 fL (ref 78.0–100.0)
MONOS PCT: 6 % (ref 3–12)
Monocytes Absolute: 0.4 10*3/uL (ref 0.1–1.0)
NEUTROS ABS: 6 10*3/uL (ref 1.7–7.7)
Neutrophils Relative %: 74 % (ref 43–77)
Platelets: 256 10*3/uL (ref 150–400)
RBC: 3.92 MIL/uL (ref 3.87–5.11)
RDW: 13.6 % (ref 11.5–15.5)
WBC: 8 10*3/uL (ref 4.0–10.5)

## 2014-03-05 LAB — BASIC METABOLIC PANEL
Anion gap: 10 (ref 5–15)
BUN: 26 mg/dL — AB (ref 6–23)
CALCIUM: 9.5 mg/dL (ref 8.4–10.5)
CHLORIDE: 102 meq/L (ref 96–112)
CO2: 26 meq/L (ref 19–32)
Creatinine, Ser: 0.82 mg/dL (ref 0.50–1.10)
GFR calc Af Amer: 72 mL/min — ABNORMAL LOW (ref >90)
GFR calc non Af Amer: 63 mL/min — ABNORMAL LOW (ref >90)
Glucose, Bld: 87 mg/dL (ref 70–99)
Potassium: 4.3 mEq/L (ref 3.7–5.3)
Sodium: 138 mEq/L (ref 137–147)

## 2014-03-05 LAB — URINALYSIS, ROUTINE W REFLEX MICROSCOPIC
Bilirubin Urine: NEGATIVE
Glucose, UA: NEGATIVE mg/dL
HGB URINE DIPSTICK: NEGATIVE
Ketones, ur: NEGATIVE mg/dL
Nitrite: NEGATIVE
Protein, ur: NEGATIVE mg/dL
SPECIFIC GRAVITY, URINE: 1.012 (ref 1.005–1.030)
Urobilinogen, UA: 0.2 mg/dL (ref 0.0–1.0)
pH: 7.5 (ref 5.0–8.0)

## 2014-03-05 LAB — URINE MICROSCOPIC-ADD ON

## 2014-03-05 MED ORDER — IBUPROFEN 400 MG PO TABS
600.0000 mg | ORAL_TABLET | Freq: Once | ORAL | Status: AC
  Administered 2014-03-05: 600 mg via ORAL
  Filled 2014-03-05 (×2): qty 1

## 2014-03-05 NOTE — ED Provider Notes (Signed)
CSN: [id number]     Arrival date & time 03/05/14  7829 History   First MD Initiated Contact with Patient 03/05/14 [phone]     Chief Complaint  Patient presents with  . Hip Pain     (Consider location/radiation/quality/duration/timing/severity/associated sxs/prior Treatment) Patient is a 80 y.o. female presenting with hip pain.  Hip Pain This is a new problem. Episode onset: 3 days. The problem occurs constantly. The problem has been gradually worsening. Pertinent negatives include no chest pain, no abdominal pain, no headaches and no shortness of breath. The symptoms are aggravated by walking and standing. Nothing relieves the symptoms.    Past Medical History  Diagnosis Date  . Hypertension   . Hyperlipidemia   . Coronary artery disease     s/p CABG in November 2009  . GERD (gastroesophageal reflux disease)   . Headache(784.0)   . HOH (hard of hearing)   . Arthritis    Past Surgical History  Procedure Laterality Date  . Replacement total knee bilateral    . Cholecystectomy    . Coronary artery bypass graft  November 2009    LIMA to LAD, SVG to DX, SVG to 1st OM and SVG to RCA  . Cardiac catheterization  05/16/2008    EF 70+  . US echocardiography  05/12/2008    EF 55-60%  . Cardiovascular stress test  05/11/2008    EF 61%.ABNORMAL STRESS NUCLEAR STUDY. FINDINGS ARE CONSISTENT WITH ANTERIOR ISCHEMIA  . Abdominal hysterectomy    . Cataract extraction w/phaco Right 12/13/2013    Procedure: CATARACT EXTRACTION PHACO AND INTRAOCULAR LENS PLACEMENT (IOC);  Surgeon: Gemma Payor, MD;  Location: [facility];  Service: Ophthalmology;  Laterality: Right;  CDE 7.89  . Cataract extraction w/phaco Left 01/06/2014    Procedure: CATARACT EXTRACTION PHACO AND INTRAOCULAR LENS PLACEMENT (IOC);  Surgeon: Gemma Payor, MD;  Location: [facility];  Service: Ophthalmology;  Laterality: Left;  CDE:9.23   Family History  Problem Relation Age of Onset  . Lung cancer Mother   . Coronary artery disease Father     History  Substance Use Topics  . Smoking status: Never Smoker   . Smokeless tobacco: Not on file  . Alcohol Use: No   OB History   Grav Para Term Preterm Abortions TAB SAB Ect Mult Living                 Review of Systems  Constitutional: Negative for fever and chills.  HENT: Negative for congestion, rhinorrhea and sore throat.   Eyes: Negative for photophobia and visual disturbance.  Respiratory: Negative for cough and shortness of breath.   Cardiovascular: Negative for chest pain and leg swelling.  Gastrointestinal: Negative for nausea, vomiting, abdominal pain, diarrhea and constipation.  Endocrine: Negative for polyphagia and polyuria.  Genitourinary: Negative for dysuria, flank pain, vaginal bleeding, vaginal discharge and enuresis.  Musculoskeletal: Negative for back pain and gait problem.  Skin: Negative for color change and rash.  Neurological: Negative for dizziness, syncope, light-headedness, numbness and headaches.  Hematological: Negative for adenopathy. Does not bruise/bleed easily.  All other systems reviewed and are negative.     Allergies  Review of patient's allergies indicates no known allergies.  Home Medications   Prior to Admission medications   Medication Sig Start Date End Date Taking? Authorizing Provider  acetaminophen (TYLENOL) 500 MG tablet Take 500 mg by mouth as needed for mild pain or headache.     Historical Provider, MD  amLODipine (NORVASC) 5 MG tablet Take 5  mg by mouth daily.      Historical Provider, MD  Ascorbic Acid (VITAMIN C PO) Take 1 tablet by mouth daily.    Historical Provider, MD  aspirin 81 MG tablet Take 81 mg by mouth at bedtime.     Historical Provider, MD  atorvastatin (LIPITOR) 20 MG tablet Take 20 mg by mouth at bedtime.     Historical Provider, MD  Cholecalciferol (VITAMIN D3) 2000 UNITS TABS Take 2,000 Units by mouth daily.     Historical Provider, MD  Ginkgo Biloba 120 MG TABS Take 120 mg by mouth daily.      Historical Provider, MD  lisinopril (PRINIVIL,ZESTRIL) 20 MG tablet TAKE 1 TABLET (20 MG TOTAL) BY MOUTH DAILY.    Rosalio Macadamia, NP  metoprolol succinate (TOPROL-XL) 50 MG 24 hr tablet TAKE 1 TABLET DAILY 06/24/12   Rosalio Macadamia, NP  Multiple Vitamin (MULTIVITAMIN) tablet Take 1 tablet by mouth daily.      Historical Provider, MD  Omega-3 Fatty Acids (FISH OIL) 1200 MG CAPS Take 1 capsule by mouth daily.      Historical Provider, MD  pantoprazole (PROTONIX) 40 MG tablet Take 40 mg by mouth daily.    Historical Provider, MD  Simethicone (GAS-X PO) Take 1 tablet by mouth daily as needed. Indigestion     Historical Provider, MD  vitamin B-12 (CYANOCOBALAMIN) 1000 MCG tablet Take 1,500 mcg by mouth daily.    Historical Provider, MD  zolpidem (AMBIEN) 5 MG tablet Take 2.5-5 mg by mouth at bedtime as needed for sleep.  04/27/12   Historical Provider, MD   BP 159/90  Pulse 66  Temp(Src) 98.1 F (36.7 C) (Oral)  SpO2 95% Physical Exam  Vitals reviewed. Constitutional: She is oriented to person, place, and time. She appears well-developed and well-nourished.  HENT:  Head: Normocephalic and atraumatic.  Right Ear: External ear normal.  Left Ear: External ear normal.  Eyes: Conjunctivae and EOM are normal. Pupils are equal, round, and reactive to light.  Neck: Normal range of motion. Neck supple.  Cardiovascular: Normal rate, regular rhythm, normal heart sounds and intact distal pulses.   Pulmonary/Chest: Effort normal and breath sounds normal.  Abdominal: Soft. Bowel sounds are normal. There is no tenderness.  Musculoskeletal: Normal range of motion.       Left hip: She exhibits normal range of motion, normal strength, no tenderness, no bony tenderness and no swelling.       Left ankle: She exhibits normal pulse.       Cervical back: Normal.       Thoracic back: Normal.       Lumbar back: Normal.       Left lower leg: She exhibits no edema.  Neurological: She is alert and oriented to  person, place, and time.  Skin: Skin is warm and dry.    ED Course  Procedures (including critical care time) Labs Review Labs Reviewed  CBC WITH DIFFERENTIAL - Abnormal; Notable for the following:    Hemoglobin 11.6 (*)    HCT 35.8 (*)    All other components within normal limits  BASIC METABOLIC PANEL - Abnormal; Notable for the following:    BUN 26 (*)    GFR calc non Af Amer 63 (*)    GFR calc Af Amer 72 (*)    All other components within normal limits  URINALYSIS, ROUTINE W REFLEX MICROSCOPIC    Imaging Review Dg Hip Complete Left  03/05/2014   CLINICAL DATA:  Left  hip pain  EXAM: LEFT HIP - COMPLETE 2+ VIEW  COMPARISON:  None.  FINDINGS: There is no evidence of hip fracture or dislocation. There is no evidence of arthropathy or other focal bone abnormality.  IMPRESSION: No acute abnormality noted.   Electronically Signed   By: Alcide Clever M.D.   On: 03/05/2014 10:47     EKG Interpretation None      MDM   Final diagnoses:  Hip pain, left    80 y.o. female  with pertinent PMH of OA, HTN presents with atraumatic left hip pain x3 days. Patient has had generalized weakness and fatigue for which she was started on protonix presumptively to improve her eating.  No other systemic symptoms including nausea, vomiting, fever, cough, chest pain, or abdominal pain. Arrival today the patient's vital signs and physical exam are as above, no significant tenderness of left hip and good range of motion. The patient repeatedly denies falls.    Labs and imaging as above reviewed. X-ray and lab work unremarkable for patient baseline. Patient was able to angulate with assistance to the bathroom, which is per her norm she normally walks with a cane and in fact has a walker. No signs of infectious arthritis, patient has intact range of motion, doubt occult fracture given no history of trauma. Patient was given standard return precautions for occult pathology and informed to followup with her PCP  early next week.  1. Hip pain, left         Mirian Mo, MD 03/05/14 1110

## 2014-03-05 NOTE — ED Notes (Signed)
Patient C/O left hip pain that began on Wednesday.  Patient has difficulty standing and sitting, but can stand and can walk even though it is painful. Patient ambulates with a walker normally.  Patient denies a fall or other trauma. States that sh has arthritis, but not in her hips.

## 2014-03-05 NOTE — Discharge Instructions (Signed)

## 2014-06-13 ENCOUNTER — Emergency Department (HOSPITAL_COMMUNITY): Payer: Medicare HMO

## 2014-06-13 ENCOUNTER — Inpatient Hospital Stay (HOSPITAL_COMMUNITY)
Admission: EM | Admit: 2014-06-13 | Discharge: 2014-06-15 | DRG: 066 | Disposition: A | Discharge status: Home / Self Care | Payer: Medicare HMO | Attending: Family Medicine | Admitting: Family Medicine

## 2014-06-13 ENCOUNTER — Encounter (HOSPITAL_COMMUNITY): Payer: Self-pay | Admitting: Emergency Medicine

## 2014-06-13 DIAGNOSIS — I440 Atrioventricular block, first degree: Secondary | ICD-10-CM | POA: Diagnosis present

## 2014-06-13 DIAGNOSIS — K219 Gastro-esophageal reflux disease without esophagitis: Secondary | ICD-10-CM | POA: Diagnosis present

## 2014-06-13 DIAGNOSIS — Z9841 Cataract extraction status, right eye: Secondary | ICD-10-CM | POA: Diagnosis not present

## 2014-06-13 DIAGNOSIS — M1990 Unspecified osteoarthritis, unspecified site: Secondary | ICD-10-CM | POA: Diagnosis present

## 2014-06-13 DIAGNOSIS — Z90710 Acquired absence of both cervix and uterus: Secondary | ICD-10-CM | POA: Diagnosis not present

## 2014-06-13 DIAGNOSIS — E785 Hyperlipidemia, unspecified: Secondary | ICD-10-CM | POA: Diagnosis present

## 2014-06-13 DIAGNOSIS — Z96653 Presence of artificial knee joint, bilateral: Secondary | ICD-10-CM | POA: Diagnosis present

## 2014-06-13 DIAGNOSIS — I10 Essential (primary) hypertension: Secondary | ICD-10-CM | POA: Diagnosis present

## 2014-06-13 DIAGNOSIS — Z79899 Other long term (current) drug therapy: Secondary | ICD-10-CM | POA: Diagnosis not present

## 2014-06-13 DIAGNOSIS — Z961 Presence of intraocular lens: Secondary | ICD-10-CM | POA: Diagnosis present

## 2014-06-13 DIAGNOSIS — I671 Cerebral aneurysm, nonruptured: Secondary | ICD-10-CM | POA: Diagnosis present

## 2014-06-13 DIAGNOSIS — I2510 Atherosclerotic heart disease of native coronary artery without angina pectoris: Secondary | ICD-10-CM | POA: Diagnosis present

## 2014-06-13 DIAGNOSIS — I639 Cerebral infarction, unspecified: Secondary | ICD-10-CM | POA: Diagnosis present

## 2014-06-13 DIAGNOSIS — R42 Dizziness and giddiness: Secondary | ICD-10-CM | POA: Diagnosis present

## 2014-06-13 DIAGNOSIS — H9190 Unspecified hearing loss, unspecified ear: Secondary | ICD-10-CM | POA: Diagnosis present

## 2014-06-13 DIAGNOSIS — H547 Unspecified visual loss: Secondary | ICD-10-CM | POA: Diagnosis present

## 2014-06-13 DIAGNOSIS — Z951 Presence of aortocoronary bypass graft: Secondary | ICD-10-CM

## 2014-06-13 DIAGNOSIS — Z9842 Cataract extraction status, left eye: Secondary | ICD-10-CM | POA: Diagnosis not present

## 2014-06-13 DIAGNOSIS — Z7982 Long term (current) use of aspirin: Secondary | ICD-10-CM

## 2014-06-13 LAB — CBC WITH DIFFERENTIAL/PLATELET
BASOS ABS: 0 10*3/uL (ref 0.0–0.1)
Basophils Relative: 0 % (ref 0–1)
Eosinophils Absolute: 0 10*3/uL (ref 0.0–0.7)
Eosinophils Relative: 0 % (ref 0–5)
HCT: 36.2 % (ref 36.0–46.0)
Hemoglobin: 11.7 g/dL — ABNORMAL LOW (ref 12.0–15.0)
LYMPHS ABS: 1 10*3/uL (ref 0.7–4.0)
LYMPHS PCT: 16 % (ref 12–46)
MCH: 29.7 pg (ref 26.0–34.0)
MCHC: 32.3 g/dL (ref 30.0–36.0)
MCV: 91.9 fL (ref 78.0–100.0)
MONO ABS: 0.2 10*3/uL (ref 0.1–1.0)
Monocytes Relative: 4 % (ref 3–12)
NEUTROS ABS: 5 10*3/uL (ref 1.7–7.7)
Neutrophils Relative %: 80 % — ABNORMAL HIGH (ref 43–77)
Platelets: 224 10*3/uL (ref 150–400)
RBC: 3.94 MIL/uL (ref 3.87–5.11)
RDW: 13.1 % (ref 11.5–15.5)
WBC: 6.3 10*3/uL (ref 4.0–10.5)

## 2014-06-13 LAB — COMPREHENSIVE METABOLIC PANEL
ALT: 12 U/L (ref 0–35)
AST: 23 U/L (ref 0–37)
Albumin: 3.7 g/dL (ref 3.5–5.2)
Alkaline Phosphatase: 86 U/L (ref 39–117)
Anion gap: 13 (ref 5–15)
BILIRUBIN TOTAL: 0.4 mg/dL (ref 0.3–1.2)
BUN: 21 mg/dL (ref 6–23)
CHLORIDE: 101 meq/L (ref 96–112)
CO2: 25 meq/L (ref 19–32)
CREATININE: 0.78 mg/dL (ref 0.50–1.10)
Calcium: 10 mg/dL (ref 8.4–10.5)
GFR calc Af Amer: 84 mL/min — ABNORMAL LOW (ref >90)
GFR, EST NON AFRICAN AMERICAN: 73 mL/min — AB (ref >90)
Glucose, Bld: 109 mg/dL — ABNORMAL HIGH (ref 70–99)
Potassium: 4.6 mEq/L (ref 3.7–5.3)
Sodium: 139 mEq/L (ref 137–147)
Total Protein: 7.4 g/dL (ref 6.0–8.3)

## 2014-06-13 LAB — URINALYSIS, ROUTINE W REFLEX MICROSCOPIC
Bilirubin Urine: NEGATIVE
GLUCOSE, UA: NEGATIVE mg/dL
Hgb urine dipstick: NEGATIVE
Ketones, ur: NEGATIVE mg/dL
LEUKOCYTES UA: NEGATIVE
Nitrite: NEGATIVE
Protein, ur: NEGATIVE mg/dL
Specific Gravity, Urine: 1.01 (ref 1.005–1.030)
Urobilinogen, UA: 0.2 mg/dL (ref 0.0–1.0)
pH: 6 (ref 5.0–8.0)

## 2014-06-13 LAB — TROPONIN I

## 2014-06-13 MED ORDER — HYDROCHLOROTHIAZIDE 12.5 MG PO CAPS
12.5000 mg | ORAL_CAPSULE | Freq: Every day | ORAL | Status: DC
  Administered 2014-06-13 – 2014-06-15 (×3): 12.5 mg via ORAL
  Filled 2014-06-13 (×3): qty 1

## 2014-06-13 MED ORDER — VITAMIN E 45 MG (100 UNIT) PO CAPS
100.0000 [IU] | ORAL_CAPSULE | Freq: Every day | ORAL | Status: DC
  Administered 2014-06-14 – 2014-06-15 (×2): 100 [IU] via ORAL
  Filled 2014-06-13 (×4): qty 1

## 2014-06-13 MED ORDER — LISINOPRIL 10 MG PO TABS
20.0000 mg | ORAL_TABLET | Freq: Every day | ORAL | Status: DC
  Administered 2014-06-13 – 2014-06-15 (×3): 20 mg via ORAL
  Filled 2014-06-13 (×3): qty 2

## 2014-06-13 MED ORDER — SIMETHICONE 80 MG PO CHEW: 80.0000 mg | CHEWABLE_TABLET | Freq: Four times a day (QID) | ORAL | Status: DC | PRN

## 2014-06-13 MED ORDER — PANTOPRAZOLE SODIUM 40 MG PO TBEC
40.0000 mg | DELAYED_RELEASE_TABLET | Freq: Every day | ORAL | Status: DC
  Administered 2014-06-13 – 2014-06-15 (×3): 40 mg via ORAL
  Filled 2014-06-13 (×3): qty 1

## 2014-06-13 MED ORDER — ASPIRIN EC 325 MG PO TBEC
325.0000 mg | DELAYED_RELEASE_TABLET | Freq: Every day | ORAL | Status: DC
  Administered 2014-06-13 – 2014-06-15 (×3): 325 mg via ORAL
  Filled 2014-06-13 (×3): qty 1

## 2014-06-13 MED ORDER — ASPIRIN EC 81 MG PO TBEC: 81.0000 mg | DELAYED_RELEASE_TABLET | Freq: Every day | ORAL | Status: DC

## 2014-06-13 MED ORDER — ADULT MULTIVITAMIN W/MINERALS CH
1.0000 | ORAL_TABLET | Freq: Every day | ORAL | Status: DC
  Administered 2014-06-13 – 2014-06-15 (×3): 1 via ORAL
  Filled 2014-06-13 (×3): qty 1

## 2014-06-13 MED ORDER — MIRTAZAPINE 30 MG PO TABS
15.0000 mg | ORAL_TABLET | Freq: Every day | ORAL | Status: DC
  Administered 2014-06-13 – 2014-06-14 (×2): 15 mg via ORAL
  Filled 2014-06-13 (×2): qty 1

## 2014-06-13 MED ORDER — ACETAMINOPHEN 500 MG PO TABS: 500.0000 mg | ORAL_TABLET | Freq: Four times a day (QID) | ORAL | Status: DC | PRN

## 2014-06-13 MED ORDER — METOPROLOL SUCCINATE ER 50 MG PO TB24
50.0000 mg | ORAL_TABLET | Freq: Every day | ORAL | Status: DC
Start: 2014-06-14 — End: 2014-06-15
  Administered 2014-06-14 – 2014-06-15 (×2): 50 mg via ORAL
  Filled 2014-06-13 (×2): qty 1

## 2014-06-13 MED ORDER — MECLIZINE HCL 12.5 MG PO TABS
25.0000 mg | ORAL_TABLET | Freq: Once | ORAL | Status: AC
  Administered 2014-06-13: 25 mg via ORAL
  Filled 2014-06-13: qty 2

## 2014-06-13 MED ORDER — VITAMIN B-12 1000 MCG PO TABS
1000.0000 ug | ORAL_TABLET | Freq: Every day | ORAL | Status: DC
  Administered 2014-06-14 – 2014-06-15 (×2): 1000 ug via ORAL
  Filled 2014-06-13 (×2): qty 1

## 2014-06-13 MED ORDER — ZOLPIDEM TARTRATE 5 MG PO TABS
5.0000 mg | ORAL_TABLET | Freq: Every evening | ORAL | Status: DC | PRN
  Administered 2014-06-13 – 2014-06-14 (×2): 5 mg via ORAL
  Filled 2014-06-13 (×2): qty 1

## 2014-06-13 MED ORDER — AMLODIPINE BESYLATE 5 MG PO TABS
5.0000 mg | ORAL_TABLET | Freq: Every day | ORAL | Status: DC
  Administered 2014-06-14 – 2014-06-15 (×2): 5 mg via ORAL
  Filled 2014-06-13 (×2): qty 1

## 2014-06-13 MED ORDER — HEPARIN SODIUM (PORCINE) 5000 UNIT/ML IJ SOLN
5000.0000 [IU] | Freq: Three times a day (TID) | INTRAMUSCULAR | Status: DC
  Administered 2014-06-13 – 2014-06-15 (×5): 5000 [IU] via SUBCUTANEOUS
  Filled 2014-06-13 (×5): qty 1

## 2014-06-13 MED ORDER — STROKE: EARLY STAGES OF RECOVERY BOOK
Freq: Once | Status: AC
  Administered 2014-06-13: 22:00:00
  Filled 2014-06-13 (×2): qty 1

## 2014-06-13 MED ORDER — OMEGA-3-ACID ETHYL ESTERS 1 G PO CAPS
1.0000 | ORAL_CAPSULE | Freq: Every day | ORAL | Status: DC
  Administered 2014-06-13 – 2014-06-15 (×3): 1 g via ORAL
  Filled 2014-06-13 (×3): qty 1

## 2014-06-13 MED ORDER — ATORVASTATIN CALCIUM 20 MG PO TABS
20.0000 mg | ORAL_TABLET | Freq: Every day | ORAL | Status: DC
  Administered 2014-06-13 – 2014-06-14 (×2): 20 mg via ORAL
  Filled 2014-06-13 (×2): qty 1

## 2014-06-13 MED ORDER — LISINOPRIL-HYDROCHLOROTHIAZIDE 20-12.5 MG PO TABS: 1.0000 | ORAL_TABLET | Freq: Every day | ORAL | Status: DC

## 2014-06-13 NOTE — H&P (Signed)
[facility] History and Physical  Laura CrazeJuanita J Cahoon WGN:[id number] DOB: 06/12/1924 DOA: 06/13/2014  Referring physician: ER PCP: Josue HectorNYLAND,LEONARD ROBERT, MD   Chief Complaint: Altered vision  HPI: Laura Woodard is a 80 y.o. female  This is an 80 year old lady, hypertensive, who noticed this morning that her vision was changed suddenly when she woke up. She was unable to delineate objects. Throughout the day, her vision has improved and she is able to see quite clearly. She did not really have any movement problems, limb weakness, speech problems. Evaluation in the emergency room shows it to have a left occipital CVA shown by MRI scan. She is now being admitted for further management.   Review of Systems:  Constitutional:  No weight loss, night sweats, Fevers, chills, fatigue.  HEENT:  No headaches, Difficulty swallowing,Tooth/dental problems,Sore throat,  No sneezing, itching, ear ache, nasal congestion, post nasal drip,  Cardio-vascular:  No chest pain, Orthopnea, PND, swelling in lower extremities, anasarca, dizziness, palpitations  GI:  No heartburn, indigestion, abdominal pain, nausea, vomiting, diarrhea, change in bowel habits, loss of appetite  Resp:  No shortness of breath with exertion or at rest. No excess mucus, no productive cough, No non-productive cough, No coughing up of blood.No change in color of mucus.No wheezing.No chest wall deformity  Skin:  no rash or lesions.  GU:  no dysuria, change in color of urine, no urgency or frequency. No flank pain.  Musculoskeletal:  No joint pain or swelling. No decreased range of motion. No back pain.  Psych:  No change in mood or affect. No depression or anxiety. No memory loss.   Past Medical History  Diagnosis Date  . Hypertension   . Hyperlipidemia   . Coronary artery disease     s/p CABG in November 2009  . GERD (gastroesophageal reflux disease)   . Headache(784.0)   . HOH (hard of hearing)   . Arthritis     Past Surgical History  Procedure Laterality Date  . Replacement total knee bilateral    . Cholecystectomy    . Coronary artery bypass graft  November 2009    LIMA to LAD, SVG to DX, SVG to 1st OM and SVG to RCA  . Cardiac catheterization  05/16/2008    EF 70+  . [place] echocardiography  05/12/2008    EF 55-60%  . Cardiovascular stress test  05/11/2008    EF 61%.ABNORMAL STRESS NUCLEAR STUDY. FINDINGS ARE CONSISTENT WITH ANTERIOR ISCHEMIA  . Abdominal hysterectomy    . Cataract extraction w/phaco Right 12/13/2013    Procedure: CATARACT EXTRACTION PHACO AND INTRAOCULAR LENS PLACEMENT (IOC);  Surgeon: Gemma PayorKerry Hunt, MD;  Location: [facility];  Service: Ophthalmology;  Laterality: Right;  CDE 7.89  . Cataract extraction w/phaco Left 01/06/2014    Procedure: CATARACT EXTRACTION PHACO AND INTRAOCULAR LENS PLACEMENT (IOC);  Surgeon: Gemma PayorKerry Hunt, MD;  Location: [facility];  Service: Ophthalmology;  Laterality: Left;  CDE:9.23   Social History:  reports that she has never smoked. She does not have any smokeless tobacco history on file. She reports that she does not drink alcohol or use illicit drugs.  No Known Allergies  Family History  Problem Relation Age of Onset  . Lung cancer Mother   . Coronary artery disease Father      Prior to Admission medications   Medication Sig Start Date End Date Taking? Authorizing Provider  acetaminophen (TYLENOL) 500 MG tablet Take 500 mg by mouth as needed for mild pain or headache.    Yes  Historical Provider, MD  amLODipine (NORVASC) 5 MG tablet Take 5 mg by mouth daily.     Yes Historical Provider, MD  aspirin EC 81 MG tablet Take 81 mg by mouth daily.   Yes Historical Provider, MD  atorvastatin (LIPITOR) 20 MG tablet Take 20 mg by mouth at bedtime.    Yes Historical Provider, MD  lisinopril-hydrochlorothiazide (PRINZIDE,ZESTORETIC) 20-12.5 MG per tablet Take 1 tablet by mouth daily.   Yes Historical Provider, MD  metoprolol succinate (TOPROL-XL) 50 MG 24 hr tablet Take  50 mg by mouth daily. Take with or immediately following a meal.   Yes Historical Provider, MD  mirtazapine (REMERON) 15 MG tablet Take 15 mg by mouth at bedtime. 03/01/14  Yes Historical Provider, MD  Multiple Vitamin (MULTIVITAMIN) tablet Take 1 tablet by mouth daily.     Yes Historical Provider, MD  Omega-3 Fatty Acids (FISH OIL) 1200 MG CAPS Take 1 capsule by mouth daily.     Yes Historical Provider, MD  pantoprazole (PROTONIX) 40 MG tablet Take 40 mg by mouth daily.   Yes Historical Provider, MD  Simethicone (GAS-X PO) Take 1 tablet by mouth daily as needed. Indigestion    Yes Historical Provider, MD  vitamin B-12 (CYANOCOBALAMIN) 1000 MCG tablet Take 1,000 mcg by mouth daily.    Yes Historical Provider, MD  vitamin E 100 UNIT capsule Take 100 Units by mouth daily.   Yes Historical Provider, MD  zolpidem (AMBIEN) 5 MG tablet Take 5 mg by mouth at bedtime as needed for sleep.  04/27/12  Yes Historical Provider, MD   Physical Exam: Filed Vitals:   06/13/14 1130 06/13/14 1200 06/13/14 1419 06/13/14 1617  BP: 153/78 136/67 134/83 141/73  Pulse: 61 67 70 73  Temp:    98 F (36.7 C)  TempSrc:    Oral  Resp: 14 13 13 16   Height:      Weight:      SpO2: 97% 97% 98% 97%    Wt Readings from Last 3 Encounters:  06/13/14 56.246 kg (124 lb)  06/13/14 56.246 kg (124 lb)  12/06/13 54.432 kg (120 lb)    General:  Appears calm and comfortable. Alert and oriented. Eyes: PERRL, normal lids, irises & conjunctiva ENT: grossly normal hearing, lips & tongue Neck: no LAD, masses or thyromegaly Cardiovascular: RRR, no m/r/g. No LE edema. Telemetry: SR, no arrhythmias  Respiratory: CTA bilaterally, no w/r/r. Normal respiratory effort. Abdomen: soft, ntnd Skin: no rash or induration seen on limited exam Musculoskeletal: grossly normal tone BUE/BLE Psychiatric: grossly normal mood and affect, speech fluent and appropriate Neurologic: grossly non-focal. In particular, her vision is perfectly normal  now and she is able to read the time on the clock in the room very clearly.           Labs on Admission:  Basic Metabolic Panel:  Recent Labs Lab 06/13/14 1115  NA 139  K 4.6  CL 101  CO2 25  GLUCOSE 109*  BUN 21  CREATININE 0.78  CALCIUM 10.0   Liver Function Tests:  Recent Labs Lab 06/13/14 1115  AST 23  ALT 12  ALKPHOS 86  BILITOT 0.4  PROT 7.4  ALBUMIN 3.7   No results for input(s): LIPASE, AMYLASE in the last 168 hours. No results for input(s): AMMONIA in the last 168 hours. CBC:  Recent Labs Lab 06/13/14 1115  WBC 6.3  NEUTROABS 5.0  HGB 11.7*  HCT 36.2  MCV 91.9  PLT 224   Cardiac Enzymes:  Recent Labs Lab 06/13/14 1115  TROPONINI <0.30    BNP (last 3 results) No results for input(s): PROBNP in the last 8760 hours. CBG: No results for input(s): GLUCAP in the last 168 hours.  Radiological Exams on Admission: Dg Chest 2 View  06/13/2014   CLINICAL DATA:  Dizziness and hypertension.  Visual disturbance.  EXAM: CHEST  2 VIEW  COMPARISON:  05/24/2008  FINDINGS: Heart size and pulmonary vascularity are normal. There is tortuosity and calcification of the thoracic aorta. Prior CABG.  The lungs are clear.  No effusions.  No acute osseous abnormalities.  IMPRESSION: No acute abnormalities.   Electronically Signed   By: Geanie CooleyJim  Maxwell M.D.   On: 06/13/2014 13:04   Ct Head Wo Contrast  06/13/2014   CLINICAL DATA:  Dizziness for several days.  EXAM: CT HEAD WITHOUT CONTRAST  TECHNIQUE: Contiguous axial images were obtained from the base of the skull through the vertex without intravenous contrast.  COMPARISON:  12/13/2011  FINDINGS: No mass lesion. No midline shift. No acute hemorrhage or hematoma. No extra-axial fluid collections. No evidence of acute infarction. Brain parenchyma is normal. Osseous structures are normal except for minimal chronic mucosal thickening in the sphenoid sinus.  IMPRESSION: No significant abnormalities.   Electronically Signed   By:  Geanie CooleyJim  Maxwell M.D.   On: 06/13/2014 14:12   Mr Brain Wo Contrast (neuro Protocol)  06/13/2014   CLINICAL DATA:  Dizziness earlier today, since resolved.  EXAM: MRI HEAD WITHOUT CONTRAST  TECHNIQUE: Multiplanar, multiecho pulse sequences of the brain and surrounding structures were obtained without intravenous contrast.  COMPARISON:  Head CT 06/13/2014  FINDINGS: There is a 3 mm punctate focus of increased diffusion-weighted signal in the left occipital white matter without definite restricted diffusion on the ADC map, although evaluation is limited by its small size. There is no other evidence of acute infarct. Incidental note is made of a partially empty sella. Moderate pannus is noted posterior to the dens with slight deflection of the cervicomedullary junction.  There is no evidence of intracranial hemorrhage, mass, midline shift, or extra-axial fluid collection. There is mild generalized cerebral atrophy, within normal limits for age. Patchy T2 hyperintensities throughout the subcortical and deep cerebral white matter and pons are nonspecific but compatible with mild to moderate chronic small vessel ischemic disease. Dilated perivascular spaces are noted in the inferior basal ganglia. Small, chronic left cerebellar infarct is noted.  Prior bilateral cataract extraction is noted. Right sphenoid sinus mucosal thickening is noted. Mastoid air cells are clear. Major intracranial vascular flow voids are preserved.  IMPRESSION: 1. Suspected punctate, acute to subacute left occipital infarct. 2. Mild to moderate chronic small vessel ischemic disease. 3. Small, chronic left cerebellar infarct.   Electronically Signed   By: Sebastian AcheAllen  Grady   On: 06/13/2014 15:19     Assessment/Plan   1. Acute left occipital CVA. 2. Hypertension. 3. Coronary artery disease, stable.  Plan: 1. Admit to telemetry. 2. Stroke workup. 3. Increased dose of aspirin. 4. Neurology consultation.  Further recommendations will depend  on patient's hospital progress.   Code Status: Full code   DVT Prophylaxis: Heparin.  Family Communication: I discussed the plan with the patient at the bedside.   Disposition Plan: Home when medically stable.   Time spent: 60 minutes.  Wilson SingerGOSRANI,Marlean Mortell C [facility] Pager [phone].

## 2014-06-13 NOTE — ED Notes (Signed)
Pt took "2 81mg  aspirins" this morning at 10am, and reports taking BP medication this morning.

## 2014-06-13 NOTE — Plan of Care (Signed)
Problem: Consults Goal: Skin Care Protocol Initiated - if Braden Score 18 or less If consults are not indicated, leave blank or document N/A Outcome: Not Applicable Date Met:  06/13/14     

## 2014-06-13 NOTE — ED Notes (Signed)
Attempted report; Ladona Ridgelaylor, RN will call back when finished with d/c of patient.

## 2014-06-13 NOTE — ED Provider Notes (Signed)
CSN: [id number]     Arrival date & time 06/13/14  1020 History   First MD Initiated Contact with Patient 06/13/14 1147     Chief Complaint  Patient presents with  . Dizziness     HPI Pt was seen at 1205.  Per pt, c/o sudden onset and resolution of several intermittent episodes of "dizziness" that began this morning after she woke up. Pt states she woke up approximately 0530, lifted her head up while laying on her bed to look at the clock, and "then everything started moving around." Pt states she laid her head back down/still with improvement. Pt states her symptoms recur with moving her head side to side, body position changes and ambulation. Has been associated with several episodes of N/V. States she feels "ok" laying on the ED stretcher. Denies CP/palpitations, no SOB/cough, no abd pain, no N/V/D, no fevers, no falls, no headache, no visual changes, no focal motor weakness, no tingling/numbness in extremities.    Past Medical History  Diagnosis Date  . Hypertension   . Hyperlipidemia   . Coronary artery disease     s/p CABG in November 2009  . GERD (gastroesophageal reflux disease)   . Headache(784.0)   . HOH (hard of hearing)   . Arthritis    Past Surgical History  Procedure Laterality Date  . Replacement total knee bilateral    . Cholecystectomy    . Coronary artery bypass graft  November 2009    LIMA to LAD, SVG to DX, SVG to 1st OM and SVG to RCA  . Cardiac catheterization  05/16/2008    EF 70+  . US echocardiography  05/12/2008    EF 55-60%  . Cardiovascular stress test  05/11/2008    EF 61%.ABNORMAL STRESS NUCLEAR STUDY. FINDINGS ARE CONSISTENT WITH ANTERIOR ISCHEMIA  . Abdominal hysterectomy    . Cataract extraction w/phaco Right 12/13/2013    Procedure: CATARACT EXTRACTION PHACO AND INTRAOCULAR LENS PLACEMENT (IOC);  Surgeon: Gemma Payor, MD;  Location: [facility];  Service: Ophthalmology;  Laterality: Right;  CDE 7.89  . Cataract extraction w/phaco Left 01/06/2014     Procedure: CATARACT EXTRACTION PHACO AND INTRAOCULAR LENS PLACEMENT (IOC);  Surgeon: Gemma Payor, MD;  Location: [facility];  Service: Ophthalmology;  Laterality: Left;  CDE:9.23   Family History  Problem Relation Age of Onset  . Lung cancer Mother   . Coronary artery disease Father    History  Substance Use Topics  . Smoking status: Never Smoker   . Smokeless tobacco: Not on file  . Alcohol Use: No    Review of Systems ROS: Statement: All systems negative except as marked or noted in the HPI; Constitutional: Negative for fever and chills. ; ; Eyes: Negative for eye pain, redness and discharge. ; ; ENMT: Negative for ear pain, hoarseness, nasal congestion, sinus pressure and sore throat. ; ; Cardiovascular: Negative for chest pain, palpitations, diaphoresis, dyspnea and peripheral edema. ; ; Respiratory: Negative for cough, wheezing and stridor. ; ; Gastrointestinal: +N/V. Negative for diarrhea, abdominal pain, blood in stool, hematemesis, jaundice and rectal bleeding. . ; ; Genitourinary: Negative for dysuria, flank pain and hematuria. ; ; Musculoskeletal: Negative for back pain and neck pain. Negative for swelling and trauma.; ; Skin: Negative for pruritus, rash, abrasions, blisters, bruising and skin lesion.; ; Neuro: +"dizziness." Negative for headache, lightheadedness and neck stiffness. Negative for weakness, altered level of consciousness , altered mental status, extremity weakness, paresthesias, involuntary movement, seizure and syncope.  Allergies  Review of patient's allergies indicates no known allergies.  Home Medications   Prior to Admission medications   Medication Sig Start Date End Date Taking? Authorizing Provider  acetaminophen (TYLENOL) 500 MG tablet Take 500 mg by mouth as needed for mild pain or headache.    Yes Historical Provider, MD  amLODipine (NORVASC) 5 MG tablet Take 5 mg by mouth daily.     Yes Historical Provider, MD  aspirin EC 81 MG tablet Take 81 mg by  mouth daily.   Yes Historical Provider, MD  atorvastatin (LIPITOR) 20 MG tablet Take 20 mg by mouth at bedtime.    Yes Historical Provider, MD  lisinopril-hydrochlorothiazide (PRINZIDE,ZESTORETIC) 20-12.5 MG per tablet Take 1 tablet by mouth daily.   Yes Historical Provider, MD  metoprolol succinate (TOPROL-XL) 50 MG 24 hr tablet Take 50 mg by mouth daily. Take with or immediately following a meal.   Yes Historical Provider, MD  mirtazapine (REMERON) 15 MG tablet Take 15 mg by mouth at bedtime. 03/01/14  Yes Historical Provider, MD  Multiple Vitamin (MULTIVITAMIN) tablet Take 1 tablet by mouth daily.     Yes Historical Provider, MD  Omega-3 Fatty Acids (FISH OIL) 1200 MG CAPS Take 1 capsule by mouth daily.     Yes Historical Provider, MD  pantoprazole (PROTONIX) 40 MG tablet Take 40 mg by mouth daily.   Yes Historical Provider, MD  Simethicone (GAS-X PO) Take 1 tablet by mouth daily as needed. Indigestion    Yes Historical Provider, MD  vitamin B-12 (CYANOCOBALAMIN) 1000 MCG tablet Take 1,000 mcg by mouth daily.    Yes Historical Provider, MD  vitamin E 100 UNIT capsule Take 100 Units by mouth daily.   Yes Historical Provider, MD  zolpidem (AMBIEN) 5 MG tablet Take 5 mg by mouth at bedtime as needed for sleep.  04/27/12  Yes Historical Provider, MD   BP 136/67 mmHg  Pulse 67  Temp(Src) 97.6 F (36.4 C) (Oral)  Resp 13  Ht 5\' 2"  (1.575 m)  Wt 124 lb (56.246 kg)  BMI 22.67 kg/m2  SpO2 97%   13:41:12 Orthostatic Vital Signs RA  Orthostatic Lying  - BP- Lying: 146/86 mmHg ; Pulse- Lying: 76  Orthostatic Sitting - BP- Sitting: 142/100 mmHg ; Pulse- Sitting: 81  Orthostatic Standing at 0 minutes - BP- Standing at 0 minutes: 140/89 mmHg ; Pulse- Standing at 0 minutes: 80    Physical Exam  1210: Physical examination:  Nursing notes reviewed; Vital signs and O2 SAT reviewed;  Constitutional: Well developed, Well nourished, Well hydrated, In no acute distress; Head:  Normocephalic, atraumatic;  Eyes: EOMI, PERRL, No scleral icterus; ENMT: Mouth and pharynx normal, Mucous membranes moist; Neck: Supple, Full range of motion, No lymphadenopathy; Cardiovascular: Regular rate and rhythm, No gallop; Respiratory: Breath sounds clear & equal bilaterally, No wheezes.  Speaking full sentences with ease, Normal respiratory effort/excursion; Chest: Nontender, Movement normal; Abdomen: Soft, Nontender, Nondistended, Normal bowel sounds; Genitourinary: No CVA tenderness; Extremities: Pulses normal, No tenderness, No edema, No calf edema or asymmetry.; Neuro: AA&Ox3, Major CN grossly intact.Speech clear.  No facial droop.  +right horizontal gaze fatigable nystagmus which reproduces pt's pain.. Grips equal. Strength 5/5 equal bilat UE's and LE's.  DTR 2/4 equal bilat UE's and LE's.  No gross sensory deficits.  Normal cerebellar testing bilat UE's (finger-nose) and LE's (heel-shin)..; Skin: Color normal, Warm, Dry.   ED Course  Procedures     EKG Interpretation None      MDM  MDM Reviewed: previous chart, nursing note and vitals Reviewed previous: labs and ECG Interpretation: CT scan, MRI, x-ray, ECG and labs      Date: 06/13/2014  Rate: 71  Rhythm: normal sinus rhythm  QRS Axis: normal  Intervals: PR prolonged  ST/T Wave abnormalities: normal  Conduction Disutrbances:first-degree A-V block   Narrative Interpretation:   Old EKG Reviewed: unchanged; no significant changes since previous EKG dated 11/25/2012.   Results for orders placed or performed during the hospital encounter of 06/13/14  CBC with Differential  Result Value Ref Range   WBC 6.3 4.0 - 10.5 K/uL   RBC 3.94 3.87 - 5.11 MIL/uL   Hemoglobin 11.7 (L) 12.0 - 15.0 g/dL   HCT 01.636.2 01.036.0 - 93.246.0 %   MCV 91.9 78.0 - 100.0 fL   MCH 29.7 26.0 - 34.0 pg   MCHC 32.3 30.0 - 36.0 g/dL   RDW 35.513.1 73.211.5 - 20.215.5 %   Platelets 224 150 - 400 K/uL   Neutrophils Relative % 80 (H) 43 - 77 %   Neutro Abs 5.0 1.7 - 7.7 K/uL   Lymphocytes  Relative 16 12 - 46 %   Lymphs Abs 1.0 0.7 - 4.0 K/uL   Monocytes Relative 4 3 - 12 %   Monocytes Absolute 0.2 0.1 - 1.0 K/uL   Eosinophils Relative 0 0 - 5 %   Eosinophils Absolute 0.0 0.0 - 0.7 K/uL   Basophils Relative 0 0 - 1 %   Basophils Absolute 0.0 0.0 - 0.1 K/uL  Comprehensive metabolic panel  Result Value Ref Range   Sodium 139 137 - 147 mEq/L   Potassium 4.6 3.7 - 5.3 mEq/L   Chloride 101 96 - 112 mEq/L   CO2 25 19 - 32 mEq/L   Glucose, Bld 109 (H) 70 - 99 mg/dL   BUN 21 6 - 23 mg/dL   Creatinine, Ser 5.420.78 0.50 - 1.10 mg/dL   Calcium 70.610.0 8.4 - 23.710.5 mg/dL   Total Protein 7.4 6.0 - 8.3 g/dL   Albumin 3.7 3.5 - 5.2 g/dL   AST 23 0 - 37 U/L   ALT 12 0 - 35 U/L   Alkaline Phosphatase 86 39 - 117 U/L   Total Bilirubin 0.4 0.3 - 1.2 mg/dL   GFR calc non Af Amer 73 (L) >90 mL/min   GFR calc Af Amer 84 (L) >90 mL/min   Anion gap 13 5 - 15  Troponin I  Result Value Ref Range   Troponin I <0.30 <0.30 ng/mL  Urinalysis, Routine w reflex microscopic  Result Value Ref Range   Color, Urine YELLOW YELLOW   APPearance CLEAR CLEAR   Specific Gravity, Urine 1.010 1.005 - 1.030   pH 6.0 5.0 - 8.0   Glucose, UA NEGATIVE NEGATIVE mg/dL   Hgb urine dipstick NEGATIVE NEGATIVE   Bilirubin Urine NEGATIVE NEGATIVE   Ketones, ur NEGATIVE NEGATIVE mg/dL   Protein, ur NEGATIVE NEGATIVE mg/dL   Urobilinogen, UA 0.2 0.0 - 1.0 mg/dL   Nitrite NEGATIVE NEGATIVE   Leukocytes, UA NEGATIVE NEGATIVE   Dg Chest 2 View 06/13/2014   CLINICAL DATA:  Dizziness and hypertension.  Visual disturbance.  EXAM: CHEST  2 VIEW  COMPARISON:  05/24/2008  FINDINGS: Heart size and pulmonary vascularity are normal. There is tortuosity and calcification of the thoracic aorta. Prior CABG.  The lungs are clear.  No effusions.  No acute osseous abnormalities.  IMPRESSION: No acute abnormalities.   Electronically Signed   By: Geanie CooleyJim  Maxwell  M.D.   On: 06/13/2014 13:04   Ct Head Wo Contrast 06/13/2014   CLINICAL DATA:   Dizziness for several days.  EXAM: CT HEAD WITHOUT CONTRAST  TECHNIQUE: Contiguous axial images were obtained from the base of the skull through the vertex without intravenous contrast.  COMPARISON:  12/13/2011  FINDINGS: No mass lesion. No midline shift. No acute hemorrhage or hematoma. No extra-axial fluid collections. No evidence of acute infarction. Brain parenchyma is normal. Osseous structures are normal except for minimal chronic mucosal thickening in the sphenoid sinus.  IMPRESSION: No significant abnormalities.   Electronically Signed   By: Geanie Cooley M.D.   On: 06/13/2014 14:12    1400:  Pt given antivert then ambulated. Pt continued to c/o feeling "a little dizzy" while ambulating. Will obtain MRI brain r/o CVA.    Mr Brain Wo Contrast (neuro Protocol) 06/13/2014   CLINICAL DATA:  Dizziness earlier today, since resolved.  EXAM: MRI HEAD WITHOUT CONTRAST  TECHNIQUE: Multiplanar, multiecho pulse sequences of the brain and surrounding structures were obtained without intravenous contrast.  COMPARISON:  Head CT 06/13/2014  FINDINGS: There is a 3 mm punctate focus of increased diffusion-weighted signal in the left occipital white matter without definite restricted diffusion on the ADC map, although evaluation is limited by its small size. There is no other evidence of acute infarct. Incidental note is made of a partially empty sella. Moderate pannus is noted posterior to the dens with slight deflection of the cervicomedullary junction.  There is no evidence of intracranial hemorrhage, mass, midline shift, or extra-axial fluid collection. There is mild generalized cerebral atrophy, within normal limits for age. Patchy T2 hyperintensities throughout the subcortical and deep cerebral white matter and pons are nonspecific but compatible with mild to moderate chronic small vessel ischemic disease. Dilated perivascular spaces are noted in the inferior basal ganglia. Small, chronic left cerebellar infarct is  noted.  Prior bilateral cataract extraction is noted. Right sphenoid sinus mucosal thickening is noted. Mastoid air cells are clear. Major intracranial vascular flow voids are preserved.  IMPRESSION: 1. Suspected punctate, acute to subacute left occipital infarct. 2. Mild to moderate chronic small vessel ischemic disease. 3. Small, chronic left cerebellar infarct.   Electronically Signed   By: Sebastian Ache   On: 06/13/2014 15:19     1650:  Pt not code stroke due to waking up with symptoms. Pt is not orthostatic on VS. Dx and testing d/w pt and family.  Questions answered.  Verb understanding, agreeable to admit. T/C to [facility]. Karilyn Cota, case discussed, including:  HPI, pertinent PM/SHx, VS/PE, dx testing, ED course and treatment:  Agreeable to admit, requests to write temporary orders, obtain observation tele bed to team [facility].   Samuel Jester, DO 06/15/14 2319

## 2014-06-13 NOTE — ED Notes (Signed)
Patient states dizziness that started upon awakening this morning. Patient wakes up around 0530, went to bed last night at 1900 and was normal. Daughter states her mother called her this morning at six with complaints. Patient states the room is spinning and she is nauseous. Alert/oriented x 4. Speech clear.

## 2014-06-13 NOTE — ED Notes (Signed)
Dr. McManus at bedside. 

## 2014-06-13 NOTE — ED Notes (Signed)
Pt ambulated slowly without difficulty. Pt did report "I still feel a little off balance."

## 2014-06-14 ENCOUNTER — Inpatient Hospital Stay (HOSPITAL_COMMUNITY): Payer: Medicare HMO

## 2014-06-14 DIAGNOSIS — I25119 Atherosclerotic heart disease of native coronary artery with unspecified angina pectoris: Secondary | ICD-10-CM

## 2014-06-14 DIAGNOSIS — I6309 Cerebral infarction due to thrombosis of other precerebral artery: Secondary | ICD-10-CM

## 2014-06-14 LAB — HEMOGLOBIN A1C
Hgb A1c MFr Bld: 6 % — ABNORMAL HIGH (ref <5.7)
Mean Plasma Glucose: 126 mg/dL — ABNORMAL HIGH (ref <117)

## 2014-06-14 LAB — URINE CULTURE
Colony Count: NO GROWTH
Culture: NO GROWTH

## 2014-06-14 LAB — LIPID PANEL
CHOL/HDL RATIO: 2.6 ratio
CHOLESTEROL: 120 mg/dL (ref 0–200)
HDL: 46 mg/dL (ref >39)
LDL Cholesterol: 45 mg/dL (ref 0–99)
Triglycerides: 143 mg/dL (ref <150)
VLDL: 29 mg/dL (ref 0–40)

## 2014-06-14 LAB — GLUCOSE, CAPILLARY: Glucose-Capillary: 102 mg/dL — ABNORMAL HIGH (ref 70–99)

## 2014-06-14 MED ORDER — ASPIRIN EC 325 MG PO TBEC: 325.0000 mg | DELAYED_RELEASE_TABLET | Freq: Every day | ORAL | Status: DC

## 2014-06-14 NOTE — Progress Notes (Signed)
UR chart review completed.  

## 2014-06-14 NOTE — Evaluation (Signed)
Physical Therapy Evaluation Patient Details Name: Laura CrazeJuanita J Woodard MRN: [id number] DOB: 05/01/1924 Today's Date: 06/14/2014   History of Present Illness  This is an 80 year old lady, hypertensive, who noticed this morning that her vision was changed suddenly when she woke up. She was unable to delineate objects. Throughout the day, her vision has improved and she is able to see quite clearly. She did not really have any movement problems, limb weakness, speech problems. Evaluation in the emergency room shows it to have a left occipital CVA shown by MRI scan. She is now being admitted for further management.  Clinical Impression   Pt was seen for evaluation and found to be at functional baseline.  Her visual problems have resolved and she was found to have no mobility problems.  She is independent at home, occasional use of a cane.  I have recommended a life line device for home safety but she is concerned about the cost.    Follow Up Recommendations No PT follow up    Equipment Recommendations  None recommended by PT    Recommendations for Other Services   none    Precautions / Restrictions Precautions Precautions: None Restrictions Weight Bearing Restrictions: No      Mobility  Bed Mobility Overal bed mobility: Independent                Transfers Overall transfer level: Independent                  Ambulation/Gait Ambulation/Gait assistance: Independent Ambulation Distance (Feet): 100 Feet Assistive device: None Gait Pattern/deviations: WFL(Within Functional Limits)   Gait velocity interpretation: at or above normal speed for age/gender    Stairs            Wheelchair Mobility    Modified Rankin (Stroke Patients Only)       Balance Overall balance assessment: Independent;No apparent balance deficits (not formally assessed)                                           Pertinent Vitals/Pain Pain Assessment: No/denies pain     Home Living Family/patient expects to be discharged to:: Private residence Living Arrangements: Alone (Pt's son lives next door) Available Help at Discharge: Family;Available PRN/intermittently Type of Home: House   Entrance Stairs-Rails: None Entrance Stairs-Number of Steps: 1 Home Layout: One level Home Equipment: Cane - single point      Prior Function Level of Independence: Independent         Comments: uses a cane when outside of the home     Hand Dominance   Dominant Hand: Right    Extremity/Trunk Assessment   Upper Extremity Assessment: Defer to OT evaluation           Lower Extremity Assessment: Overall WFL for tasks assessed      Cervical / Trunk Assessment: Kyphotic  Communication   Communication: No difficulties  Cognition Arousal/Alertness: Awake/alert Behavior During Therapy: WFL for tasks assessed/performed Overall Cognitive Status: Within Functional Limits for tasks assessed                      General Comments      Exercises        Assessment/Plan    PT Assessment Patent does not need any further PT services  PT Diagnosis     PT Problem List    PT Treatment  Interventions     PT Goals (Current goals can be found in the Care Plan section) Acute Rehab PT Goals PT Goal Formulation: All assessment and education complete, DC therapy    Frequency     Barriers to discharge  none      Co-evaluation               End of Session Equipment Utilized During Treatment: Gait belt Activity Tolerance: Patient tolerated treatment well Patient left: in bed;with call bell/phone within reach;with bed alarm set           Time: [id number] PT Time Calculation (min) (ACUTE ONLY): 19 min   Charges:   PT Evaluation $Initial PT Evaluation Tier I: 1 Procedure     PT G CodesMyrlene Woodard:          Laura Woodard 06/14/2014, 10:58 AM

## 2014-06-14 NOTE — Evaluation (Signed)
Occupational Therapy Evaluation Patient Details Name: Laura CrazeJuanita J Masur MRN: [id number] DOB: 11/07/1923 Today's Date: 06/14/2014    History of Present Illness This is an 80 year old lady, hypertensive, who noticed this morning that her vision was changed suddenly when she woke up. She was unable to delineate objects. Throughout the day, her vision has improved and she is able to see quite clearly. She did not really have any movement problems, limb weakness, speech problems. Evaluation in the emergency room shows it to have a left occipital CVA shown by MRI scan. She is now being admitted for further management.   Clinical Impression   Pt is presenting to acute OT with above situation.  She is currently presenting with WFL strength, ROM, and sensation in BUE.  Pt reports that all visual changes have resolved.  Pt demonstrated good skills with ADL skills and does not have any ADL/IADL concerns about returning home.  Pt does not need further OT services at this time.      Follow Up Recommendations  No OT follow up    Equipment Recommendations  None recommended by OT    Recommendations for Other Services       Precautions / Restrictions Precautions Precautions: None Restrictions Weight Bearing Restrictions: No      Mobility Bed Mobility Overal bed mobility: Independent                Transfers Overall transfer level: Independent                    Balance Overall balance assessment: Independent;No apparent balance deficits (not formally assessed)                                          ADL Overall ADL's : Independent                                             Vision                     Perception     Praxis      Pertinent Vitals/Pain Pain Assessment: No/denies pain     Hand Dominance Right   Extremity/Trunk Assessment Upper Extremity Assessment Upper Extremity Assessment: Overall WFL for tasks  assessed   Lower Extremity Assessment Lower Extremity Assessment: Overall WFL for tasks assessed     Communication Communication Communication: No difficulties   Cognition Arousal/Alertness: Awake/alert Behavior During Therapy: WFL for tasks assessed/performed Overall Cognitive Status: Within Functional Limits for tasks assessed                     General Comments       Exercises       Shoulder Instructions      Home Living Family/patient expects to be discharged to:: Private residence Living Arrangements: Alone (Son lives next door) Available Help at Discharge: Family;Available PRN/intermittently Type of Home: House   Entrance Stairs-Number of Steps: 1 Entrance Stairs-Rails: None Home Layout: One level     Bathroom Shower/Tub: Producer, television/film/videoWalk-in shower   Bathroom Toilet: Standard     Home Equipment: Shower seat - built in;Walker - 4 wheels;Cane - single point;Grab bars - tub/shower          Prior Functioning/Environment Level of Independence:  Independent        Comments: uses a cane when outside of the home. compeltes all ADL and IADL tasks independently.  Daughter-in-law drives pt for errands.    OT Diagnosis:     OT Problem List:     OT Treatment/Interventions:      OT Goals(Current goals can be found in the care plan section) Acute Rehab OT Goals Patient Stated Goal: No OT goals needed OT Goal Formulation: With patient  OT Frequency:     Barriers to D/C:            Co-evaluation              End of Session    Activity Tolerance: Patient tolerated treatment well Patient left: in bed;with call bell/phone within reach;with bed alarm set;with family/visitor present   Time: [id number] OT Time Calculation (min): 9 min Charges:  OT General Charges $OT Visit: 1 Procedure OT Evaluation $Initial OT Evaluation Tier I: 1 Procedure G-Codes:     Marry GuanMarie Rawlings Shahana Capes, MS, OTR/L [facility] [phone] 06/14/2014,  11:29 AM

## 2014-06-14 NOTE — Consult Note (Signed)
[place] A. Merlene Laughter, MD     [email or web address]          Laura Woodard is an 80 y.o. female.   ASSESSMENT/PLAN: 1. Lacunar infarct involving the deep white matter occipital region on the left side. Risk factors hypertension, dyslipidemia, coronary artery disease and age. The patient's aspirin has been increased appropriately. Continue with risk factor modification. Typical workup is pending.  Patient is an 80 year old white female who developed acute onset of visual impairment/obscuration. The event lasted only for several minutes. She has returned to baseline. The patient does not report focal numbness, weakness, dysphagia, dysarthria, headache, chest pain or shortness of breath. She reports feeling well and is back to baseline. The review of systems otherwise negative.  GENERAL: This is a pleasant thin female in no acute distress.  HEENT: There is moderate hearing impairment.  ABDOMEN: soft  EXTREMITIES: No edema. Status post bilateral total knee arthroplasty. Significant arthritic changes of the legs and upper extremities.   BACK: Normal.  SKIN: Normal by inspection.    MENTAL STATUS: Alert and oriented. Speech, language and cognition are generally intact. Judgment and insight normal.   CRANIAL NERVES: Pupils are equal, round and reactive to light and accomodation; extra ocular movements are full, there is no significant nystagmus; visual fields are full; upper and lower facial muscles are normal in strength and symmetric, there is no flattening of the nasolabial folds; tongue is midline; uvula is midline; shoulder elevation is normal.  MOTOR: Normal tone, bulk and strength; no pronator drift.  COORDINATION: Left finger to nose is normal, right finger to nose is normal, No rest tremor; no intention tremor; no postural tremor; no bradykinesia.  REFLEXES: Deep tendon reflexes are symmetrical and normal. Babinski reflexes are equivocal bilaterally.    SENSATION: Normal to light touch, temperature, and pinprick.   The patient's brain MRI is reviewed in person. There is a tiny increased signal seen on diffusion imaging involving the left deep occipital white matter area. There is moderate global atrophy. There are scattered multiple increased signal seen on FLAIR imaging. These are non-confluent however.    Blood pressure 120/56, pulse 67, temperature 98.4 F (36.9 C), temperature source Oral, resp. rate 16, height _0  (1.575 m), weight 56.246 kg (124 lb), SpO2 95 %.  Past Medical History  Diagnosis Date  . Hypertension   . Hyperlipidemia   . Coronary artery disease     s/p CABG in November 2009  . GERD (gastroesophageal reflux disease)   . Headache(784.0)   . HOH (hard of hearing)   . Arthritis     Past Surgical History  Procedure Laterality Date  . Replacement total knee bilateral    . Cholecystectomy    . Coronary artery bypass graft  November 2009    LIMA to LAD, SVG to DX, SVG to 1st OM and SVG to RCA  . Cardiac catheterization  05/16/2008    EF 70+  . US echocardiography  05/12/2008    EF 55-60%  . Cardiovascular stress test  05/11/2008    EF 61%.ABNORMAL STRESS NUCLEAR STUDY. FINDINGS ARE CONSISTENT WITH ANTERIOR ISCHEMIA  . Abdominal hysterectomy    . Cataract extraction w/phaco Right 12/13/2013    Procedure: CATARACT EXTRACTION PHACO AND INTRAOCULAR LENS PLACEMENT (IOC);  Surgeon: Tonny Branch, MD;  Location: [facility];  Service: Ophthalmology;  Laterality: Right;  CDE 7.89  . Cataract extraction w/phaco Left 01/06/2014    Procedure: CATARACT EXTRACTION PHACO AND INTRAOCULAR LENS PLACEMENT (IOC);  Surgeon:  Tonny Branch, MD;  Location: [facility];  Service: Ophthalmology;  Laterality: Left;  CDE:9.23    Family History  Problem Relation Age of Onset  . Lung cancer Mother   . Coronary artery disease Father     Social History:  reports that she has never smoked. She does not have any smokeless tobacco history on file. She  reports that she does not drink alcohol or use illicit drugs.  Allergies: No Known Allergies  Medications: Prior to Admission medications   Medication Sig Start Date End Date Taking? Authorizing Provider  acetaminophen (TYLENOL) 500 MG tablet Take 500 mg by mouth as needed for mild pain or headache.    Yes Historical Provider, MD  amLODipine (NORVASC) 5 MG tablet Take 5 mg by mouth daily.     Yes Historical Provider, MD  aspirin EC 81 MG tablet Take 81 mg by mouth daily.   Yes Historical Provider, MD  atorvastatin (LIPITOR) 20 MG tablet Take 20 mg by mouth at bedtime.    Yes Historical Provider, MD  lisinopril-hydrochlorothiazide (PRINZIDE,ZESTORETIC) 20-12.5 MG per tablet Take 1 tablet by mouth daily.   Yes Historical Provider, MD  metoprolol succinate (TOPROL-XL) 50 MG 24 hr tablet Take 50 mg by mouth daily. Take with or immediately following a meal.   Yes Historical Provider, MD  mirtazapine (REMERON) 15 MG tablet Take 15 mg by mouth at bedtime. 03/01/14  Yes Historical Provider, MD  Multiple Vitamin (MULTIVITAMIN) tablet Take 1 tablet by mouth daily.     Yes Historical Provider, MD  Omega-3 Fatty Acids (FISH OIL) 1200 MG CAPS Take 1 capsule by mouth daily.     Yes Historical Provider, MD  pantoprazole (PROTONIX) 40 MG tablet Take 40 mg by mouth daily.   Yes Historical Provider, MD  Simethicone (GAS-X PO) Take 1 tablet by mouth daily as needed. Indigestion    Yes Historical Provider, MD  vitamin B-12 (CYANOCOBALAMIN) 1000 MCG tablet Take 1,000 mcg by mouth daily.    Yes Historical Provider, MD  vitamin E 100 UNIT capsule Take 100 Units by mouth daily.   Yes Historical Provider, MD  zolpidem (AMBIEN) 5 MG tablet Take 5 mg by mouth at bedtime as needed for sleep.  04/27/12  Yes Historical Provider, MD    Scheduled Meds: . amLODipine  5 mg Oral Daily  . aspirin EC  325 mg Oral Daily  . atorvastatin  20 mg Oral QHS  . heparin  5,000 Units Subcutaneous 3 times per day  . lisinopril  20 mg  Oral Daily   And  . hydrochlorothiazide  12.5 mg Oral Daily  . metoprolol succinate  50 mg Oral Daily  . mirtazapine  15 mg Oral QHS  . multivitamin with minerals  1 tablet Oral Daily  . omega-3 acid ethyl esters  1 capsule Oral Daily  . pantoprazole  40 mg Oral Daily  . vitamin B-12  1,000 mcg Oral Daily  . vitamin E  100 Units Oral Daily   Continuous Infusions:  PRN Meds:.acetaminophen, simethicone, zolpidem     Results for orders placed or performed during the hospital encounter of 06/13/14 (from the past 48 hour(s))  CBC with Differential     Status: Abnormal   Collection Time: 06/13/14 11:15 AM  Result Value Ref Range   WBC 6.3 4.0 - 10.5 K/uL   RBC 3.94 3.87 - 5.11 MIL/uL   Hemoglobin 11.7 (L) 12.0 - 15.0 g/dL   HCT 36.2 36.0 - 46.0 %   MCV 91.9  78.0 - 100.0 fL   MCH 29.7 26.0 - 34.0 pg   MCHC 32.3 30.0 - 36.0 g/dL   RDW 13.1 11.5 - 15.5 %   Platelets 224 150 - 400 K/uL   Neutrophils Relative % 80 (H) 43 - 77 %   Neutro Abs 5.0 1.7 - 7.7 K/uL   Lymphocytes Relative 16 12 - 46 %   Lymphs Abs 1.0 0.7 - 4.0 K/uL   Monocytes Relative 4 3 - 12 %   Monocytes Absolute 0.2 0.1 - 1.0 K/uL   Eosinophils Relative 0 0 - 5 %   Eosinophils Absolute 0.0 0.0 - 0.7 K/uL   Basophils Relative 0 0 - 1 %   Basophils Absolute 0.0 0.0 - 0.1 K/uL  Comprehensive metabolic panel     Status: Abnormal   Collection Time: 06/13/14 11:15 AM  Result Value Ref Range   Sodium 139 137 - 147 mEq/L   Potassium 4.6 3.7 - 5.3 mEq/L   Chloride 101 96 - 112 mEq/L   CO2 25 19 - 32 mEq/L   Glucose, Bld 109 (H) 70 - 99 mg/dL   BUN 21 6 - 23 mg/dL   Creatinine, Ser 0.78 0.50 - 1.10 mg/dL   Calcium 10.0 8.4 - 10.5 mg/dL   Total Protein 7.4 6.0 - 8.3 g/dL   Albumin 3.7 3.5 - 5.2 g/dL   AST 23 0 - 37 U/L   ALT 12 0 - 35 U/L   Alkaline Phosphatase 86 39 - 117 U/L   Total Bilirubin 0.4 0.3 - 1.2 mg/dL   GFR calc non Af Amer 73 (L) >90 mL/min   GFR calc Af Amer 84 (L) >90 mL/min    Comment: (NOTE) The  eGFR has been calculated using the CKD EPI equation. This calculation has not been validated in all clinical situations. eGFR's persistently <90 mL/min signify possible Chronic Kidney Disease.    Anion gap 13 5 - 15  Troponin I     Status: None   Collection Time: 06/13/14 11:15 AM  Result Value Ref Range   Troponin I <0.30 <0.30 ng/mL    Comment:        Due to the release kinetics of cTnI, a negative result within the first hours of the onset of symptoms does not rule out myocardial infarction with certainty. If myocardial infarction is still suspected, repeat the test at appropriate intervals.   Urinalysis, Routine w reflex microscopic     Status: None   Collection Time: 06/13/14 12:35 PM  Result Value Ref Range   Color, Urine YELLOW YELLOW   APPearance CLEAR CLEAR   Specific Gravity, Urine 1.010 1.005 - 1.030   pH 6.0 5.0 - 8.0   Glucose, UA NEGATIVE NEGATIVE mg/dL   Hgb urine dipstick NEGATIVE NEGATIVE   Bilirubin Urine NEGATIVE NEGATIVE   Ketones, ur NEGATIVE NEGATIVE mg/dL   Protein, ur NEGATIVE NEGATIVE mg/dL   Urobilinogen, UA 0.2 0.0 - 1.0 mg/dL   Nitrite NEGATIVE NEGATIVE   Leukocytes, UA NEGATIVE NEGATIVE    Comment: MICROSCOPIC NOT DONE ON URINES WITH NEGATIVE PROTEIN, BLOOD, LEUKOCYTES, NITRITE, OR GLUCOSE <1000 mg/dL.  Lipid panel     Status: None   Collection Time: 06/14/14  6:12 AM  Result Value Ref Range   Cholesterol 120 0 - 200 mg/dL   Triglycerides 143 <150 mg/dL   HDL 46 >39 mg/dL   Total CHOL/HDL Ratio 2.6 RATIO   VLDL 29 0 - 40 mg/dL   LDL Cholesterol 45 0 -  99 mg/dL    Comment:        Total Cholesterol/HDL:CHD Risk Coronary Heart Disease Risk Table                     Men   Women  1/2 Average Risk   3.4   3.3  Average Risk       5.0   4.4  2 X Average Risk   9.6   7.1  3 X Average Risk  23.4   11.0        Use the calculated Patient Ratio above and the CHD Risk Table to determine the patient's CHD Risk.        ATP III CLASSIFICATION  (LDL):  <100     mg/dL   Optimal  100-129  mg/dL   Near or Above                    Optimal  130-159  mg/dL   Borderline  160-189  mg/dL   High  >190     mg/dL   Very High     Studies/Results:  BRAIN MRI 1. Suspected punctate, acute to subacute left occipital infarct. 2. Mild to moderate chronic small vessel ischemic disease. 3. Small, chronic left cerebellar infarct   Herny Scurlock A. Merlene Laughter, M.D.  Diplomate, Tax adviser of Psychiatry and Neurology ( Neurology). 06/14/2014, 7:55 AM

## 2014-06-14 NOTE — Care Management Note (Signed)
    Page 1 of 1   06/14/2014     4:12:35 PM CARE MANAGEMENT NOTE 06/14/2014  Patient:  Laura Woodard,Laura Woodard   Account Number:  [id number]  Date Initiated:  06/14/2014  Documentation initiated by:  Sharrie RothmanBLACKWELL,Quetzaly Ebner C  Subjective/Objective Assessment:   Pt admitted from home with CVA. Pt lives alone and will return home at discharge. Pts son lives next door. Pt is still very independent with ADl's.     Action/Plan:   No CM needs noted. PT did not recommend any followup.   Anticipated DC Date:  06/15/2014   Anticipated DC Plan:  HOME/SELF CARE      DC Planning Services  CM consult      Choice offered to / List presented to:             Status of service:  Completed, signed off Medicare Important Message given?   (If response is "NO", the following Medicare IM given date fields will be blank) Date Medicare IM given:   Medicare IM given by:   Date Additional Medicare IM given:   Additional Medicare IM given by:    Discharge Disposition:  HOME/SELF CARE  Per UR Regulation:    If discussed at Long Length of Stay Meetings, dates discussed:    Comments:  06/14/14 1610 Arlyss Queenammy Mahir Prabhakar, RN BSN CM

## 2014-06-14 NOTE — Discharge Summary (Addendum)
Physician Discharge Summary  Laura Woodard WJX:[id number] DOB: 05-21-1924 DOA: 06/13/2014  PCP: Josue Hector, MD  Admit date: 06/13/2014 Discharge date: 06/14/2014  Time spent: 45 minutes  This is a preliminary discharge summary  Discharge Condition: stable Diet recommendation: heart healthy  Discharge Diagnoses:  Principal Problem:   CVA (cerebral infarction) Active Problems:   CAD (coronary artery disease)   HTN (hypertension)   History of present illness:  This is an 80 year old lady, hypertensive, who noticed this morning that her vision was changed suddenly when she woke up. She was unable to delineate objects. Throughout the day, her vision has improved and she is able to see quite clearly. She did not really have any movement problems, limb weakness, speech problems. Evaluation in the emergency room shows it to have a left occipital CVA shown by MRI scan. She is now being admitted for further management.  Hospital Course:  CVA-punctate, acute to subacute left occipital infarct. - symptoms of visual disturbances resolved.  - Carotid duplex negative for stenosis - ECHO pending - ASA increased from 81 to 325 - cont Statin- normal Lipid panel  Left ICA aneurysm - see report below- aneurysm in left opthalmic segment noted on MRI-   HTN - cont current medications- BP has been stable  H/o CAD s/p CABG - cont baby ASA  Procedures:  ECHO  Consultations:  neuro  Discharge Exam: Filed Weights   06/13/14 1033 06/13/14 1917  Weight: 56.246 kg (124 lb) 56.246 kg (124 lb)   Filed Vitals:   06/14/14 1809  BP: 117/56  Pulse: 69  Temp: 98.7 F (37.1 C)  Resp: 18    General: AAO x 3, no distress Cardiovascular: RRR, no murmurs  Respiratory: clear to auscultation bilaterally GI: soft, non-tender, non-distended, bowel sound positive  Discharge Instructions You were cared for by a hospitalist during your hospital stay. If you have any questions about  your discharge medications or the care you received while you were in the hospital after you are discharged, you can call the unit and asked to speak with the hospitalist on call if the hospitalist that took care of you is not available. Once you are discharged, your primary care physician will handle any further medical issues. Please note that NO REFILLS for any discharge medications will be authorized once you are discharged, as it is imperative that you return to your primary care physician (or establish a relationship with a primary care physician if you do not have one) for your aftercare needs so that they can reassess your need for medications and monitor your lab values.     Medication List    TAKE these medications        acetaminophen 500 MG tablet  Commonly known as:  TYLENOL  Take 500 mg by mouth as needed for mild pain or headache.     amLODipine 5 MG tablet  Commonly known as:  NORVASC  Take 5 mg by mouth daily.     aspirin EC 325 MG tablet  Take 1 tablet (325 mg total) by mouth daily.     atorvastatin 20 MG tablet  Commonly known as:  LIPITOR  Take 20 mg by mouth at bedtime.     Fish Oil 1200 MG Caps  Take 1 capsule by mouth daily.     GAS-X PO  Take 1 tablet by mouth daily as needed. Indigestion     lisinopril-hydrochlorothiazide 20-12.5 MG per tablet  Commonly known as:  PRINZIDE,ZESTORETIC  Take 1 tablet  by mouth daily.     metoprolol succinate 50 MG 24 hr tablet  Commonly known as:  TOPROL-XL  Take 50 mg by mouth daily. Take with or immediately following a meal.     mirtazapine 15 MG tablet  Commonly known as:  REMERON  Take 15 mg by mouth at bedtime.     multivitamin tablet  Take 1 tablet by mouth daily.     pantoprazole 40 MG tablet  Commonly known as:  PROTONIX  Take 40 mg by mouth daily.     vitamin B-12 1000 MCG tablet  Commonly known as:  CYANOCOBALAMIN  Take 1,000 mcg by mouth daily.     vitamin E 100 UNIT capsule  Take 100 Units by  mouth daily.     zolpidem 5 MG tablet  Commonly known as:  AMBIEN  Take 5 mg by mouth at bedtime as needed for sleep.       No Known Allergies    The results of significant diagnostics from this hospitalization (including imaging, microbiology, ancillary and laboratory) are listed below for reference.    Significant Diagnostic Studies: Dg Chest 2 View  06/13/2014   CLINICAL DATA:  Dizziness and hypertension.  Visual disturbance.  EXAM: CHEST  2 VIEW  COMPARISON:  05/24/2008  FINDINGS: Heart size and pulmonary vascularity are normal. There is tortuosity and calcification of the thoracic aorta. Prior CABG.  The lungs are clear.  No effusions.  No acute osseous abnormalities.  IMPRESSION: No acute abnormalities.   Electronically Signed   By: Geanie CooleyJim  Maxwell M.D.   On: 06/13/2014 13:04   Ct Head Wo Contrast  06/13/2014   CLINICAL DATA:  Dizziness for several days.  EXAM: CT HEAD WITHOUT CONTRAST  TECHNIQUE: Contiguous axial images were obtained from the base of the skull through the vertex without intravenous contrast.  COMPARISON:  12/13/2011  FINDINGS: No mass lesion. No midline shift. No acute hemorrhage or hematoma. No extra-axial fluid collections. No evidence of acute infarction. Brain parenchyma is normal. Osseous structures are normal except for minimal chronic mucosal thickening in the sphenoid sinus.  IMPRESSION: No significant abnormalities.   Electronically Signed   By: Geanie CooleyJim  Maxwell M.D.   On: 06/13/2014 14:12   Mr Maxine GlennMra Head Wo Contrast  06/14/2014   CLINICAL DATA:  Punctate left occipital lobe infarct. Abnormal MRI the brain.  EXAM: MRA HEAD WITHOUT CONTRAST  TECHNIQUE: Angiographic images of the Circle of Willis were obtained using MRA technique without intravenous contrast.  COMPARISON:  MRI brain 06/13/2014.  FINDINGS: A 4 x 2 mm aneurysm is noted within the ophthalmic segment of the left internal carotid artery. The internal carotid arteries are otherwise within normal limits  bilaterally. The A1 and M1 segments are normal. The anterior communicating artery is patent. The MCA bifurcations are intact. Distal MCA branch vessel attenuation is seen bilaterally without a proximal stenosis or occlusion.  The left vertebral artery is the dominant vessel. The left PICA origin is visualized and normal. AICA vessels are seen bilaterally. The basilar artery is normal. The left posterior cerebral artery originates from the basilar tip. The right posterior cerebral artery is of fetal type. There is some attenuation of distal PCA branch vessels.  IMPRESSION: 1. 4 x 2 mm medial and inferior aneurysm at the left ophthalmic segment. 2. Mild to moderate distal small vessel disease without significant proximal stenosis or occlusion otherwise.   Electronically Signed   By: Gennette Pachris  Mattern M.D.   On: 06/14/2014 11:15   Mr  Brain Wo Contrast (neuro Protocol)  06/13/2014   CLINICAL DATA:  Dizziness earlier today, since resolved.  EXAM: MRI HEAD WITHOUT CONTRAST  TECHNIQUE: Multiplanar, multiecho pulse sequences of the brain and surrounding structures were obtained without intravenous contrast.  COMPARISON:  Head CT 06/13/2014  FINDINGS: There is a 3 mm punctate focus of increased diffusion-weighted signal in the left occipital white matter without definite restricted diffusion on the ADC map, although evaluation is limited by its small size. There is no other evidence of acute infarct. Incidental note is made of a partially empty sella. Moderate pannus is noted posterior to the dens with slight deflection of the cervicomedullary junction.  There is no evidence of intracranial hemorrhage, mass, midline shift, or extra-axial fluid collection. There is mild generalized cerebral atrophy, within normal limits for age. Patchy T2 hyperintensities throughout the subcortical and deep cerebral white matter and pons are nonspecific but compatible with mild to moderate chronic small vessel ischemic disease. Dilated  perivascular spaces are noted in the inferior basal ganglia. Small, chronic left cerebellar infarct is noted.  Prior bilateral cataract extraction is noted. Right sphenoid sinus mucosal thickening is noted. Mastoid air cells are clear. Major intracranial vascular flow voids are preserved.  IMPRESSION: 1. Suspected punctate, acute to subacute left occipital infarct. 2. Mild to moderate chronic small vessel ischemic disease. 3. Small, chronic left cerebellar infarct.   Electronically Signed   By: Sebastian AcheAllen  Grady   On: 06/13/2014 15:19   [place] Carotid Bilateral  06/14/2014   CLINICAL DATA:  Recent CVA  EXAM: BILATERAL CAROTID DUPLEX ULTRASOUND  TECHNIQUE: Wallace CullensGray scale imaging, color Doppler and duplex ultrasound were performed of bilateral carotid and vertebral arteries in the neck.  COMPARISON:  None.  FINDINGS: Criteria: Quantification of carotid stenosis is based on velocity parameters that correlate the residual internal carotid diameter with NASCET-based stenosis levels, using the diameter of the distal internal carotid lumen as the denominator for stenosis measurement.  The following velocity measurements were obtained:  RIGHT  ICA:  65/23 cm/sec  CCA:  57/12 cm/sec  SYSTOLIC ICA/CCA RATIO:  1.1  DIASTOLIC ICA/CCA RATIO:  1.9  ECA:  62 cm/sec  LEFT  ICA:  45/10 cm/sec  CCA:  60 T/8 cm/sec  SYSTOLIC ICA/CCA RATIO:  0.74  DIASTOLIC ICA/CCA RATIO:  1.3  ECA:  33 cm/sec  RIGHT CAROTID ARTERY: The grayscale images show mild plaque formation in the proximal internal carotid artery. The waveforms, velocities and flow velocity ratios however demonstrate no evidence of focal hemodynamically significant stenosis.  RIGHT VERTEBRAL ARTERY:  Antegrade in nature.  LEFT CAROTID ARTERY: Mild plaque formation is noted within the mid common carotid artery as well as proximal internal carotid artery. The waveforms, velocities and flow velocity ratios however demonstrate no evidence of focal hemodynamically significant stenosis.  LEFT  VERTEBRAL ARTERY:  Antegrade in nature.  IMPRESSION: Mild plaque bilaterally without focal hemodynamically significant stenosis.   Electronically Signed   By: Alcide CleverMark  Lukens M.D.   On: 06/14/2014 12:37    Microbiology: Recent Results (from the past 240 hour(s))  Urine culture     Status: None   Collection Time: 06/13/14 12:35 PM  Result Value Ref Range Status   Specimen Description URINE, CATHETERIZED  Final   Special Requests NONE  Final   Culture  Setup Time   Final    06/13/2014 22:06 [facility]formed at [facility]    Colony Count NO GROWTH Performed at [facility]   Final   Culture NO GROWTH  Performed at [facility]   Final   Report Status 06/14/2014 FINAL  Final     Labs: Basic Metabolic Panel:  Recent Labs Lab 06/13/14 1115  NA 139  K 4.6  CL 101  CO2 25  GLUCOSE 109*  BUN 21  CREATININE 0.78  CALCIUM 10.0   Liver Function Tests:  Recent Labs Lab 06/13/14 1115  AST 23  ALT 12  ALKPHOS 86  BILITOT 0.4  PROT 7.4  ALBUMIN 3.7   No results for input(s): LIPASE, AMYLASE in the last 168 hours. No results for input(s): AMMONIA in the last 168 hours. CBC:  Recent Labs Lab 06/13/14 1115  WBC 6.3  NEUTROABS 5.0  HGB 11.7*  HCT 36.2  MCV 91.9  PLT 224   Cardiac Enzymes:  Recent Labs Lab 06/13/14 1115  TROPONINI <0.30   BNP: BNP (last 3 results) No results for input(s): PROBNP in the last 8760 hours. CBG:  Recent Labs Lab 06/14/14 1648  GLUCAP 102*       SignedCalvert Cantor, MD [facility] 06/14/2014, 6:30 PM

## 2014-06-15 DIAGNOSIS — I059 Rheumatic mitral valve disease, unspecified: Secondary | ICD-10-CM

## 2014-06-15 NOTE — Progress Notes (Signed)
Pt given d/c instructions & confirmed understanding with son & daughter-in-law present. IV catheter removed from RIGHT forearm, catheter tip intact, no s/s of infection noted. Tele monitor removed from pt & central tele notified of pt's d/c home. Pt's son & daughter-in-law transporting pt home. Staff assisted pt out of facility via w/c.

## 2014-06-15 NOTE — Plan of Care (Signed)
Problem: Acute Treatment Outcomes Goal: BP within ordered parameters Outcome: Completed/Met Date Met:  06/15/14

## 2014-06-15 NOTE — Plan of Care (Signed)
Problem: Acute Treatment Outcomes Goal: Hemodynamically stable Outcome: Adequate for Discharge     

## 2014-06-15 NOTE — Plan of Care (Signed)
Problem: Acute Treatment Outcomes Goal: Hemodynamically stable Outcome: Adequate for Discharge

## 2014-06-15 NOTE — Plan of Care (Signed)
Problem: Progression Outcomes Goal: Tolerating diet/TF at goal rate Outcome: Completed/Met Date Met:  06/15/14     

## 2014-06-15 NOTE — Plan of Care (Signed)
Problem: Acute Treatment Outcomes Goal: Neuro exam at baseline or improved Outcome: Completed/Met Date Met:  06/15/14

## 2014-06-15 NOTE — Plan of Care (Signed)
Problem: Acute Treatment Outcomes Goal: tPA Patient w/o S&S of bleeding Outcome: Not Applicable Date Met:  06/15/14     

## 2014-06-15 NOTE — Discharge Summary (Signed)
Physician Discharge Summary  Laura Woodard WUJ:[id number] DOB: 09/08/1923 DOA: 06/13/2014  PCP: Josue HectorNYLAND,LEONARD ROBERT, MD  Admit date: 06/13/2014 Discharge date: 06/15/2014  Recommendations for Outpatient Follow-up:  1. Stroke. See recommendations below. 2. 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 Dr. Fernande Braseveschwarr will contact the patient and arrange for outpatient follow-up .   Follow-up Information    Follow up with Josue HectorNYLAND,LEONARD ROBERT, MD. Schedule an appointment as soon as possible for a visit in 2 weeks.   Specialty:  Family Medicine   Contact information:   [address] [place] [address] [phone]       [facility] Schedule an appointment as soon as possible for a visit in 2 months.   Specialty:  Neurology   Contact information:   [address] [place] [address] [phone]      Discharge Diagnoses:  1. Left occipital infarct, acute to subacute. 2. Hypertension 3. Hyperlipidemia 4. 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  Discharge Condition: Improved. Disposition: Home.  Diet recommendation: heart healthy  Filed Weights   06/13/14 1033 06/13/14 1917  Weight: 56.246 kg (124 lb) 56.246 kg (124 lb)    History of present illness:  80 year old woman who awoke at home with dizziness that provokes several episodes of nausea and vomiting. MRI of the brain revealed suspected punctate acute to subacute left occipital infarct.   Hospital Course:  Patient was admitted for further evaluation stroke, seen in consultation with neurology with recommendations to increase aspirin dose and follow-up as an outpatient. LDL below goal. Stroke workup was unremarkable. She was seen by physical and occupational therapy who recommended no follow-up. Individual issues as below.  1. Acute to subacute left occipital infarct, punctate with associated visual disturbance and dizziness, resolved. Risk  factors include hypertension, dyslipidemia, coronary artery disease, age. Carotid ultrasound unremarkable. 2-D echocardiogram unremarkable. 2. 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 Asymptomatic, incidental finding. Discussed in detail with patient, son, daughter-in-law. Discussed with Dr. Link Snuffereveschwar. He will make arrangements for outpatient follow-up to further discuss with patient. 3. Hypertension. Stable. 4. Hyperlipidemia. Stable.   Doing well with recovery of neurologic function.  Follow-up in the office 1-2 weeks. Follow-up with neurology in 2 months. Consultants:  Neurology  Procedures:  2-D echocardiogram LVEF 55-65%. Normal wall motion. Grade 1 diastolic dysfunction.  Discharge Instructions  Discharge Instructions    Activity as tolerated - No restrictions    Complete by:  As directed      Diet general    Complete by:  As directed      Discharge instructions    Complete by:  As directed   Call your physician or seek immediate medical attention for weakness, numbness, dizziness, visual changes or worsening of condition.          Discharge Medication List as of 06/15/2014  4:08 PM    CONTINUE these medications which have CHANGED   Details  aspirin EC 325 MG tablet Take 1 tablet (325 mg total) by mouth daily., Starting 06/14/2014, Until Discontinued, Normal      CONTINUE these medications which have NOT CHANGED   Details  acetaminophen (TYLENOL) 500 MG tablet Take 500 mg by mouth as needed for mild pain or headache. , Until Discontinued, Historical Med    amLODipine (NORVASC) 5 MG tablet Take 5 mg by mouth daily.  , Until Discontinued, Historical Med    atorvastatin (LIPITOR) 20 MG tablet  Take 20 mg by mouth at bedtime. , Until Discontinued, Historical Med    lisinopril-hydrochlorothiazide (PRINZIDE,ZESTORETIC) 20-12.5 MG per tablet Take 1 tablet by mouth daily., Until Discontinued, Historical Med    metoprolol succinate  (TOPROL-XL) 50 MG 24 hr tablet Take 50 mg by mouth daily. Take with or immediately following a meal., Until Discontinued, Historical Med    mirtazapine (REMERON) 15 MG tablet Take 15 mg by mouth at bedtime., Starting 03/01/2014, Until Discontinued, Historical Med    Multiple Vitamin (MULTIVITAMIN) tablet Take 1 tablet by mouth daily.  , Until Discontinued, Historical Med    Omega-3 Fatty Acids (FISH OIL) 1200 MG CAPS Take 1 capsule by mouth daily.  , Until Discontinued, Historical Med    pantoprazole (PROTONIX) 40 MG tablet Take 40 mg by mouth daily., Until Discontinued, Historical Med    Simethicone (GAS-X PO) Take 1 tablet by mouth daily as needed. Indigestion , Until Discontinued, Historical Med    vitamin B-12 (CYANOCOBALAMIN) 1000 MCG tablet Take 1,000 mcg by mouth daily. , Until Discontinued, Historical Med    vitamin E 100 UNIT capsule Take 100 Units by mouth daily., Until Discontinued, Historical Med      STOP taking these medications     zolpidem (AMBIEN) 5 MG tablet        No Known Allergies  The results of significant diagnostics from this hospitalization (including imaging, microbiology, ancillary and laboratory) are listed below for reference.    Significant Diagnostic Studies: Dg Chest 2 View  06/13/2014   CLINICAL DATA:  Dizziness and hypertension.  Visual disturbance.  EXAM: CHEST  2 VIEW  COMPARISON:  05/24/2008  FINDINGS: Heart size and pulmonary vascularity are normal. There is tortuosity and calcification of the thoracic aorta. Prior CABG.  The lungs are clear.  No effusions.  No acute osseous abnormalities.  IMPRESSION: No acute abnormalities.   Electronically Signed   By: Geanie Cooley M.D.   On: 06/13/2014 13:04   Ct Head Wo Contrast  06/13/2014   CLINICAL DATA:  Dizziness for several days.  EXAM: CT HEAD WITHOUT CONTRAST  TECHNIQUE: Contiguous axial images were obtained from the base of the skull through the vertex without intravenous contrast.  COMPARISON:   12/13/2011  FINDINGS: No mass lesion. No midline shift. No acute hemorrhage or hematoma. No extra-axial fluid collections. No evidence of acute infarction. Brain parenchyma is normal. Osseous structures are normal except for minimal chronic mucosal thickening in the sphenoid sinus.  IMPRESSION: No significant abnormalities.   Electronically Signed   By: Geanie Cooley M.D.   On: 06/13/2014 14:12   Mr Maxine Glenn Head Wo Contrast  06/14/2014   CLINICAL DATA:  Punctate left occipital lobe infarct. Abnormal MRI the brain.  EXAM: MRA HEAD WITHOUT CONTRAST  TECHNIQUE: Angiographic images of the Circle of Willis were obtained using MRA technique without intravenous contrast.  COMPARISON:  MRI brain 06/13/2014.  FINDINGS: A 4 x 2 mm aneurysm is noted within the ophthalmic segment of the left internal carotid artery. The internal carotid arteries are otherwise within normal limits bilaterally. The A1 and M1 segments are normal. The anterior communicating artery is patent. The MCA bifurcations are intact. Distal MCA branch vessel attenuation is seen bilaterally without a proximal stenosis or occlusion.  The left vertebral artery is the dominant vessel. The left PICA origin is visualized and normal. AICA vessels are seen bilaterally. The basilar artery is normal. The left posterior cerebral artery originates from the basilar tip. The right posterior cerebral artery is of  fetal type. There is some attenuation of distal PCA branch vessels.  IMPRESSION: 1. 4 x 2 mm medial and inferior aneurysm at the left ophthalmic segment. 2. Mild to moderate distal small vessel disease without significant proximal stenosis or occlusion otherwise.   Electronically Signed   By: Gennette Pac M.D.   On: 06/14/2014 11:15   Mr Brain Wo Contrast (neuro Protocol)  06/13/2014   CLINICAL DATA:  Dizziness earlier today, since resolved.  EXAM: MRI HEAD WITHOUT CONTRAST  TECHNIQUE: Multiplanar, multiecho pulse sequences of the brain and surrounding  structures were obtained without intravenous contrast.  COMPARISON:  Head CT 06/13/2014  FINDINGS: There is a 3 mm punctate focus of increased diffusion-weighted signal in the left occipital white matter without definite restricted diffusion on the ADC map, although evaluation is limited by its small size. There is no other evidence of acute infarct. Incidental note is made of a partially empty sella. Moderate pannus is noted posterior to the dens with slight deflection of the cervicomedullary junction.  There is no evidence of intracranial hemorrhage, mass, midline shift, or extra-axial fluid collection. There is mild generalized cerebral atrophy, within normal limits for age. Patchy T2 hyperintensities throughout the subcortical and deep cerebral white matter and pons are nonspecific but compatible with mild to moderate chronic small vessel ischemic disease. Dilated perivascular spaces are noted in the inferior basal ganglia. Small, chronic left cerebellar infarct is noted.  Prior bilateral cataract extraction is noted. Right sphenoid sinus mucosal thickening is noted. Mastoid air cells are clear. Major intracranial vascular flow voids are preserved.  IMPRESSION: 1. Suspected punctate, acute to subacute left occipital infarct. 2. Mild to moderate chronic small vessel ischemic disease. 3. Small, chronic left cerebellar infarct.   Electronically Signed   By: Sebastian Ache   On: 06/13/2014 15:19   US Carotid Bilateral  06/14/2014   CLINICAL DATA:  Recent CVA  EXAM: BILATERAL CAROTID DUPLEX ULTRASOUND  TECHNIQUE: Wallace Cullens scale imaging, color Doppler and duplex ultrasound were performed of bilateral carotid and vertebral arteries in the neck.  COMPARISON:  None.  FINDINGS: Criteria: Quantification of carotid stenosis is based on velocity parameters that correlate the residual internal carotid diameter with NASCET-based stenosis levels, using the diameter of the distal internal carotid lumen as the denominator for  stenosis measurement.  The following velocity measurements were obtained:  RIGHT  ICA:  65/23 cm/sec  CCA:  57/12 cm/sec  SYSTOLIC ICA/CCA RATIO:  1.1  DIASTOLIC ICA/CCA RATIO:  1.9  ECA:  62 cm/sec  LEFT  ICA:  45/10 cm/sec  CCA:  60 T/8 cm/sec  SYSTOLIC ICA/CCA RATIO:  0.74  DIASTOLIC ICA/CCA RATIO:  1.3  ECA:  33 cm/sec  RIGHT CAROTID ARTERY: The grayscale images show mild plaque formation in the proximal internal carotid artery. The waveforms, velocities and flow velocity ratios however demonstrate no evidence of focal hemodynamically significant stenosis.  RIGHT VERTEBRAL ARTERY:  Antegrade in nature.  LEFT CAROTID ARTERY: Mild plaque formation is noted within the mid common carotid artery as well as proximal internal carotid artery. The waveforms, velocities and flow velocity ratios however demonstrate no evidence of focal hemodynamically significant stenosis.  LEFT VERTEBRAL ARTERY:  Antegrade in nature.  IMPRESSION: Mild plaque bilaterally without focal hemodynamically significant stenosis.   Electronically Signed   By: Alcide Clever M.D.   On: 06/14/2014 12:37    Microbiology: Recent Results (from the past 240 hour(s))  Urine culture     Status: None   Collection Time: 06/13/14 12:35  PM  Result Value Ref Range Status   Specimen Description URINE, CATHETERIZED  Final   Special Requests NONE  Final   Culture  Setup Time   Final    06/13/2014 22:06 Performed at [facility]    Colony Count NO GROWTH Performed at [facility]   Final   Culture NO GROWTH Performed at [facility]   Final   Report Status 06/14/2014 FINAL  Final     Labs: Basic Metabolic Panel:  Recent Labs Lab 06/13/14 1115  NA 139  K 4.6  CL 101  CO2 25  GLUCOSE 109*  BUN 21  CREATININE 0.78  CALCIUM 10.0   Liver Function Tests:  Recent Labs Lab 06/13/14 1115  AST 23  ALT 12  ALKPHOS 86  BILITOT 0.4  PROT 7.4  ALBUMIN 3.7   CBC:  Recent Labs Lab 06/13/14 1115  WBC 6.3    NEUTROABS 5.0  HGB 11.7*  HCT 36.2  MCV 91.9  PLT 224   Cardiac Enzymes:  Recent Labs Lab 06/13/14 1115  TROPONINI <0.30   CBG:  Recent Labs Lab 06/14/14 1648  GLUCAP 102*    Principal Problem:   CVA (cerebral infarction) Active Problems:   CAD (coronary artery disease)   HTN (hypertension)   Time coordinating discharge: 35 minutes  Signed:  Brendia Sacksaniel Shereka Lafortune, MD [facility] 06/15/2014, 7:18 PM

## 2014-06-15 NOTE — Plan of Care (Signed)
Problem: Consults Goal: Nutrition Consult-if indicated Outcome: Not Applicable Date Met:  42/70/62

## 2014-06-15 NOTE — Plan of Care (Signed)
Problem: Progression Outcomes Goal: Pain controlled Outcome: Completed/Met Date Met:  06/15/14

## 2014-06-15 NOTE — Progress Notes (Signed)
Patient ID: Laura Woodard, female   DOB: [birth year], 80 y.o.   MRN: [id number]  [place] A. Merlene Laughter, MD     [email or web address]          Laura Woodard is an 80 y.o. female.   Assessment/Plan: 1. Lacunar infarct involving the deep white matter occipital region on the left side. Risk factors hypertension, dyslipidemia, coronary artery disease and age. The patient's aspirin has been increased appropriately. Continue with risk factor modification. TTE pending. FU with PCP 1-2 weeks.    GENERAL: This is a pleasant thin female in no acute distress.  HEENT: There is moderate hearing impairment.  EXTREMITIES: No edema. Status post bilateral total knee arthroplasty. Significant arthritic changes of the legs and upper extremities.   BACK: Normal.  SKIN: Normal by inspection.   MENTAL STATUS: Alert and oriented. Speech, language and cognition are generally intact. Judgment and insight normal.   CRANIAL NERVES: Pupils are equal, round and reactive to light and accomodation; extra ocular movements are full, there is no significant nystagmus; visual fields are full; upper and lower facial muscles are normal in strength and symmetric, there is no flattening of the nasolabial folds; tongue is midline; uvula is midline; shoulder elevation is normal.  MOTOR: Normal tone, bulk and strength; no pronator drift.  COORDINATION: Left finger to nose is normal, right finger to nose is normal, No rest tremor; no intention tremor; no postural tremor; no bradykinesia.      Objective: Vital signs in last 24 hours: Temp:  [98.2 F (36.8 C)-98.7 F (37.1 C)] 98.7 F (37.1 C) (12/09 0617) Pulse Rate:  [60-77] 65 (12/09 0617) Resp:  [14-18] 18 (12/09 0617) BP: (108-132)/(56-79) 120/57 mmHg (12/09 0617) SpO2:  [96 %-98 %] 97 % (12/09 0617) Weight:  [56.246 kg (124 lb)] 56.246 kg (124 lb) (12/08 0906)  Intake/Output from previous day: 12/08 0701 - 12/09 0700 In: 720  [P.O.:720] Out: 150 [Urine:150] Intake/Output this shift:   Nutritional status: Diet Heart   Lab Results: Results for orders placed or performed during the hospital encounter of 06/13/14 (from the past 48 hour(s))  CBC with Differential     Status: Abnormal   Collection Time: 06/13/14 11:15 AM  Result Value Ref Range   WBC 6.3 4.0 - 10.5 K/uL   RBC 3.94 3.87 - 5.11 MIL/uL   Hemoglobin 11.7 (L) 12.0 - 15.0 g/dL   HCT 36.2 36.0 - 46.0 %   MCV 91.9 78.0 - 100.0 fL   MCH 29.7 26.0 - 34.0 pg   MCHC 32.3 30.0 - 36.0 g/dL   RDW 13.1 11.5 - 15.5 %   Platelets 224 150 - 400 K/uL   Neutrophils Relative % 80 (H) 43 - 77 %   Neutro Abs 5.0 1.7 - 7.7 K/uL   Lymphocytes Relative 16 12 - 46 %   Lymphs Abs 1.0 0.7 - 4.0 K/uL   Monocytes Relative 4 3 - 12 %   Monocytes Absolute 0.2 0.1 - 1.0 K/uL   Eosinophils Relative 0 0 - 5 %   Eosinophils Absolute 0.0 0.0 - 0.7 K/uL   Basophils Relative 0 0 - 1 %   Basophils Absolute 0.0 0.0 - 0.1 K/uL  Comprehensive metabolic panel     Status: Abnormal   Collection Time: 06/13/14 11:15 AM  Result Value Ref Range   Sodium 139 137 - 147 mEq/L   Potassium 4.6 3.7 - 5.3 mEq/L   Chloride 101 96 - 112 mEq/L   CO2 25  19 - 32 mEq/L   Glucose, Bld 109 (H) 70 - 99 mg/dL   BUN 21 6 - 23 mg/dL   Creatinine, Ser 0.78 0.50 - 1.10 mg/dL   Calcium 10.0 8.4 - 10.5 mg/dL   Total Protein 7.4 6.0 - 8.3 g/dL   Albumin 3.7 3.5 - 5.2 g/dL   AST 23 0 - 37 U/L   ALT 12 0 - 35 U/L   Alkaline Phosphatase 86 39 - 117 U/L   Total Bilirubin 0.4 0.3 - 1.2 mg/dL   GFR calc non Af Amer 73 (L) >90 mL/min   GFR calc Af Amer 84 (L) >90 mL/min    Comment: (NOTE) The eGFR has been calculated using the CKD EPI equation. This calculation has not been validated in all clinical situations. eGFR's persistently <90 mL/min signify possible Chronic Kidney Disease.    Anion gap 13 5 - 15  Troponin I     Status: None   Collection Time: 06/13/14 11:15 AM  Result Value Ref Range    Troponin I <0.30 <0.30 ng/mL    Comment:        Due to the release kinetics of cTnI, a negative result within the first hours of the onset of symptoms does not rule out myocardial infarction with certainty. If myocardial infarction is still suspected, repeat the test at appropriate intervals.   Urine culture     Status: None   Collection Time: 06/13/14 12:35 PM  Result Value Ref Range   Specimen Description URINE, CATHETERIZED    Special Requests NONE    Culture  Setup Time      06/13/2014 22:06 Performed at [facility] Performed at [facility]     Culture NO GROWTH Performed at [facility]     Report Status 06/14/2014 FINAL   Urinalysis, Routine w reflex microscopic     Status: None   Collection Time: 06/13/14 12:35 PM  Result Value Ref Range   Color, Urine YELLOW YELLOW   APPearance CLEAR CLEAR   Specific Gravity, Urine 1.010 1.005 - 1.030   pH 6.0 5.0 - 8.0   Glucose, UA NEGATIVE NEGATIVE mg/dL   Hgb urine dipstick NEGATIVE NEGATIVE   Bilirubin Urine NEGATIVE NEGATIVE   Ketones, ur NEGATIVE NEGATIVE mg/dL   Protein, ur NEGATIVE NEGATIVE mg/dL   Urobilinogen, UA 0.2 0.0 - 1.0 mg/dL   Nitrite NEGATIVE NEGATIVE   Leukocytes, UA NEGATIVE NEGATIVE    Comment: MICROSCOPIC NOT DONE ON URINES WITH NEGATIVE PROTEIN, BLOOD, LEUKOCYTES, NITRITE, OR GLUCOSE <1000 mg/dL.  Hemoglobin A1c     Status: Abnormal   Collection Time: 06/13/14  8:01 PM  Result Value Ref Range   Hgb A1c MFr Bld 6.0 (H) <5.7 %    Comment: (NOTE)                                                                       According to the ADA Clinical Practice Recommendations for 2011, when HbA1c is used as a screening test:  >=6.5%   Diagnostic of Diabetes Mellitus           (if abnormal result is confirmed) 5.7-6.4%   Increased risk of developing Diabetes Mellitus References:Diagnosis  and Classification of Diabetes Mellitus,Diabetes MOLM,7867,54(GBEEF  1):S62-S69 and Standards of Medical Care in         Diabetes - 2011,Diabetes EOFH,2197,58 (Suppl 1):S11-S61.    Mean Plasma Glucose 126 (H) <117 mg/dL    Comment: Performed at [facility]  Lipid panel     Status: None   Collection Time: 06/14/14  6:12 AM  Result Value Ref Range   Cholesterol 120 0 - 200 mg/dL   Triglycerides 143 <150 mg/dL   HDL 46 >39 mg/dL   Total CHOL/HDL Ratio 2.6 RATIO   VLDL 29 0 - 40 mg/dL   LDL Cholesterol 45 0 - 99 mg/dL    Comment:        Total Cholesterol/HDL:CHD Risk Coronary Heart Disease Risk Table                     Men   Women  1/2 Average Risk   3.4   3.3  Average Risk       5.0   4.4  2 X Average Risk   9.6   7.1  3 X Average Risk  23.4   11.0        Use the calculated Patient Ratio above and the CHD Risk Table to determine the patient's CHD Risk.        ATP III CLASSIFICATION (LDL):  <100     mg/dL   Optimal  100-129  mg/dL   Near or Above                    Optimal  130-159  mg/dL   Borderline  160-189  mg/dL   High  >190     mg/dL   Very High   Glucose, capillary     Status: Abnormal   Collection Time: 06/14/14  4:48 PM  Result Value Ref Range   Glucose-Capillary 102 (H) 70 - 99 mg/dL   Comment 1 Notify RN     Lipid Panel  Recent Labs  06/14/14 0612  CHOL 120  TRIG 143  HDL 46  CHOLHDL 2.6  VLDL 29  LDLCALC 45    Studies/Results:   Medications:  Scheduled Meds: . amLODipine  5 mg Oral Daily  . aspirin EC  325 mg Oral Daily  . atorvastatin  20 mg Oral QHS  . heparin  5,000 Units Subcutaneous 3 times per day  . lisinopril  20 mg Oral Daily   And  . hydrochlorothiazide  12.5 mg Oral Daily  . metoprolol succinate  50 mg Oral Daily  . mirtazapine  15 mg Oral QHS  . multivitamin with minerals  1 tablet Oral Daily  . omega-3 acid ethyl esters  1 capsule Oral Daily  . pantoprazole  40 mg Oral Daily  . vitamin B-12  1,000 mcg Oral Daily  . vitamin E  100 Units Oral Daily   Continuous Infusions:  PRN  Meds:.acetaminophen, simethicone, zolpidem     LOS: 2 days   Makell Cyr A. Merlene Laughter, M.D.  Diplomate, Tax adviser of Psychiatry and Neurology ( Neurology).

## 2014-06-15 NOTE — Plan of Care (Signed)
Problem: Acute Treatment Outcomes Goal: 02 Sats > 94% Outcome: Completed/Met Date Met:  06/15/14

## 2014-06-15 NOTE — Progress Notes (Signed)
  Echocardiogram 2D Echocardiogram has been performed.  Laura Woodard 06/15/2014, 11:01 AM

## 2014-06-15 NOTE — Plan of Care (Signed)
Problem: Consults Goal: Ischemic Stroke Patient Education See Patient Education Module for education specifics.  Outcome: Completed/Met Date Met:  06/15/14

## 2014-06-15 NOTE — Plan of Care (Signed)
Problem: Progression Outcomes Goal: Communication method established Outcome: Completed/Met Date Met:  06/15/14     

## 2014-06-15 NOTE — Plan of Care (Signed)
Problem: Consults Goal: Diabetes Guidelines if Diabetic/Glucose > 140 If diabetic or lab glucose is > 140 mg/dl - Initiate Diabetes/Hyperglycemia Guidelines & Document Interventions  Outcome: Completed/Met Date Met:  06/15/14

## 2014-06-15 NOTE — Plan of Care (Signed)
Problem: Acute Treatment Outcomes Goal: Prognosis discussed with family/patient as appropriate Outcome: Completed/Met Date Met:  06/15/14

## 2014-06-15 NOTE — Progress Notes (Signed)
PROGRESS NOTE  Laura Woodard ZOX:[id number] DOB: 09/05/1923 DOA: 06/13/2014 PCP: Josue HectorNYLAND,LEONARD ROBERT, MD  Summary: 80 year old woman who awoke at home with dizziness that provokes several episodes of nausea and vomiting. MRI of the brain revealed suspected punctate acute to subacute left occipital infarct. She was seen by physical and occupational therapy who recommended no follow-up. Patient seen by neurology in consultation, recommendations were to increase aspirin, continue statin, continue blood pressure medication.  Assessment/Plan: 1. Acute to subacute left occipital infarct, punctate with associated visual disturbance and dizziness, resolved. Risk factors include hypertension, dyslipidemia, coronary artery disease, age. Carotid ultrasound unremarkable. 2-D echocardiogram unremarkable. 2. 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 Asymptomatic. Discussed in detail with patient, son, daughter-in-law. Discussed with Dr. Link Snuffereveschwar. He will make arrangements for outpatient follow-up to further discuss with patient. 3. Hypertension. Stable. 4. Hyperlipidemia. Stable.   Doing well with recovery of neurologic function.  Follow-up in the office 1-2 weeks. Follow-up with neurology in 2 months.  Brendia Sacksaniel Brason Berthelot, MD  [facility]  Pager [phone] If 7PM-7AM, please contact night-coverage at [email or web address], password [email or web address] 06/15/2014, 3:32 PM  LOS: 2 days   Consultants:  Neurology  Procedures:  2-D echocardiogram LVEF 55-65%. Normal wall motion. Grade 1 diastolic dysfunction.  Antibiotics:    HPI/Subjective: Feels well. No focal neurologic deficits. Wants to go home.  Objective: Filed Vitals:   06/14/14 2215 06/15/14 0617 06/15/14 0959 06/15/14 1400  BP: 108/61 120/57 135/55 109/62  Pulse: 66 65  63  Temp: 98.3 F (36.8 C) 98.7 F (37.1 C)  98.5 F (36.9 C)  TempSrc: Oral Oral  Oral  Resp: 14 18  18   Height:      Weight:        SpO2: 98% 97%  96%    Intake/Output Summary (Last 24 hours) at 06/15/14 1532 Last data filed at 06/15/14 1200  Gross per 24 hour  Intake    720 ml  Output    300 ml  Net    420 ml     Filed Weights   06/13/14 1033 06/13/14 1917  Weight: 56.246 kg (124 lb) 56.246 kg (124 lb)    Exam:     Afebrile, vital signs are stable. No hypoxia.  General: Alert. Speech fluent and clear. Appears calm, comfortable.  CV: Regular rate and rhythm. No murmur, rub or gallop. No lower extremity edema. Telemetry sinus rhythm.  Respiratory: Respiratory clear to auscultation bilaterally. No wheezes, rales or rhonchi. Normal respiratory effort.  Musculoskeletal: Moves all extremities well. Strength and tone grossly normal all extremities.  Neuro: Cranial nerves appear intact.  Data Reviewed:  Complete metabolic panel unremarkable on admission. Total cholesterol 120. LDL 45. CBC unremarkable on admission. Hemoglobin A1c 6.0. Urinalysis was negative.  Scheduled Meds: . amLODipine  5 mg Oral Daily  . aspirin EC  325 mg Oral Daily  . atorvastatin  20 mg Oral QHS  . heparin  5,000 Units Subcutaneous 3 times per day  . lisinopril  20 mg Oral Daily   And  . hydrochlorothiazide  12.5 mg Oral Daily  . metoprolol succinate  50 mg Oral Daily  . mirtazapine  15 mg Oral QHS  . multivitamin with minerals  1 tablet Oral Daily  . omega-3 acid ethyl esters  1 capsule Oral Daily  . pantoprazole  40 mg Oral Daily  . vitamin B-12  1,000 mcg Oral Daily  . vitamin E  100 Units Oral Daily   Continuous Infusions:  Principal Problem:   CVA (cerebral infarction) Active Problems:   CAD (coronary artery disease)   HTN (hypertension)

## 2014-06-15 NOTE — Plan of Care (Signed)
Problem: Progression Outcomes Goal: Rehab Team goals identified Outcome: Not Applicable Date Met:  23/53/61

## 2014-06-15 NOTE — Plan of Care (Signed)
Problem: Acute Treatment Outcomes Goal: Airway maintained/protected Outcome: Completed/Met Date Met:  06/15/14

## 2014-06-15 NOTE — Plan of Care (Signed)
Problem: Progression Outcomes Goal: If vent dependent, tolerates weaning Outcome: Not Applicable Date Met:  11/46/43

## 2014-06-15 NOTE — Plan of Care (Signed)
Problem: Progression Outcomes Goal: Bowel & Bladder Continence Outcome: Not Applicable Date Met:  06/15/14     

## 2014-06-16 ENCOUNTER — Other Ambulatory Visit (HOSPITAL_COMMUNITY): Payer: Self-pay | Admitting: Interventional Radiology

## 2014-06-16 DIAGNOSIS — I729 Aneurysm of unspecified site: Secondary | ICD-10-CM

## 2014-06-17 ENCOUNTER — Other Ambulatory Visit: Payer: Self-pay | Admitting: Nurse Practitioner

## 2014-06-21 ENCOUNTER — Ambulatory Visit (HOSPITAL_COMMUNITY)
Admission: RE | Admit: 2014-06-21 | Discharge: 2014-06-21 | Disposition: A | Discharge status: Home / Self Care | Payer: Medicare HMO | Source: Ambulatory Visit | Attending: Interventional Radiology | Admitting: Interventional Radiology

## 2014-06-21 DIAGNOSIS — I729 Aneurysm of unspecified site: Secondary | ICD-10-CM

## 2014-06-27 ENCOUNTER — Other Ambulatory Visit (HOSPITAL_COMMUNITY): Payer: Self-pay | Admitting: Interventional Radiology

## 2014-06-27 DIAGNOSIS — I671 Cerebral aneurysm, nonruptured: Secondary | ICD-10-CM

## 2014-07-10 ENCOUNTER — Other Ambulatory Visit: Payer: Self-pay | Admitting: Radiology

## 2014-07-12 ENCOUNTER — Ambulatory Visit (HOSPITAL_COMMUNITY)
Admission: RE | Admit: 2014-07-12 | Discharge: 2014-07-12 | Disposition: A | Discharge status: Home / Self Care | Payer: Medicare HMO | Source: Ambulatory Visit | Attending: Interventional Radiology | Admitting: Interventional Radiology

## 2014-07-12 ENCOUNTER — Other Ambulatory Visit (HOSPITAL_COMMUNITY): Payer: Self-pay | Admitting: Interventional Radiology

## 2014-07-12 ENCOUNTER — Encounter (HOSPITAL_COMMUNITY): Payer: Self-pay

## 2014-07-12 DIAGNOSIS — K219 Gastro-esophageal reflux disease without esophagitis: Secondary | ICD-10-CM | POA: Diagnosis not present

## 2014-07-12 DIAGNOSIS — Z951 Presence of aortocoronary bypass graft: Secondary | ICD-10-CM | POA: Insufficient documentation

## 2014-07-12 DIAGNOSIS — R9402 Abnormal brain scan: Secondary | ICD-10-CM | POA: Diagnosis present

## 2014-07-12 DIAGNOSIS — Z79899 Other long term (current) drug therapy: Secondary | ICD-10-CM | POA: Diagnosis not present

## 2014-07-12 DIAGNOSIS — E785 Hyperlipidemia, unspecified: Secondary | ICD-10-CM | POA: Insufficient documentation

## 2014-07-12 DIAGNOSIS — Z7982 Long term (current) use of aspirin: Secondary | ICD-10-CM | POA: Diagnosis not present

## 2014-07-12 DIAGNOSIS — I2510 Atherosclerotic heart disease of native coronary artery without angina pectoris: Secondary | ICD-10-CM | POA: Diagnosis not present

## 2014-07-12 DIAGNOSIS — I663 Occlusion and stenosis of cerebellar arteries: Secondary | ICD-10-CM | POA: Diagnosis not present

## 2014-07-12 DIAGNOSIS — I6622 Occlusion and stenosis of left posterior cerebral artery: Secondary | ICD-10-CM | POA: Diagnosis not present

## 2014-07-12 DIAGNOSIS — I6522 Occlusion and stenosis of left carotid artery: Secondary | ICD-10-CM | POA: Insufficient documentation

## 2014-07-12 DIAGNOSIS — I10 Essential (primary) hypertension: Secondary | ICD-10-CM | POA: Diagnosis not present

## 2014-07-12 DIAGNOSIS — I671 Cerebral aneurysm, nonruptured: Secondary | ICD-10-CM

## 2014-07-12 LAB — CBC WITH DIFFERENTIAL/PLATELET
Basophils Absolute: 0 10*3/uL (ref 0.0–0.1)
Basophils Relative: 0 % (ref 0–1)
EOS PCT: 2 % (ref 0–5)
Eosinophils Absolute: 0.2 10*3/uL (ref 0.0–0.7)
HCT: 38.2 % (ref 36.0–46.0)
Hemoglobin: 12.1 g/dL (ref 12.0–15.0)
LYMPHS ABS: 2 10*3/uL (ref 0.7–4.0)
LYMPHS PCT: 32 % (ref 12–46)
MCH: 29.3 pg (ref 26.0–34.0)
MCHC: 31.7 g/dL (ref 30.0–36.0)
MCV: 92.5 fL (ref 78.0–100.0)
MONO ABS: 0.4 10*3/uL (ref 0.1–1.0)
MONOS PCT: 7 % (ref 3–12)
NEUTROS ABS: 3.6 10*3/uL (ref 1.7–7.7)
Neutrophils Relative %: 59 % (ref 43–77)
Platelets: 240 10*3/uL (ref 150–400)
RBC: 4.13 MIL/uL (ref 3.87–5.11)
RDW: 13.7 % (ref 11.5–15.5)
WBC: 6.1 10*3/uL (ref 4.0–10.5)

## 2014-07-12 LAB — PROTIME-INR
INR: 1.02 (ref 0.00–1.49)
PROTHROMBIN TIME: 13.5 s (ref 11.6–15.2)

## 2014-07-12 LAB — BASIC METABOLIC PANEL
Anion gap: 7 (ref 5–15)
BUN: 24 mg/dL — ABNORMAL HIGH (ref 6–23)
CO2: 28 mmol/L (ref 19–32)
Calcium: 9.9 mg/dL (ref 8.4–10.5)
Chloride: 104 mEq/L (ref 96–112)
Creatinine, Ser: 1.01 mg/dL (ref 0.50–1.10)
GFR calc Af Amer: 56 mL/min — ABNORMAL LOW (ref >90)
GFR calc non Af Amer: 48 mL/min — ABNORMAL LOW (ref >90)
Glucose, Bld: 102 mg/dL — ABNORMAL HIGH (ref 70–99)
POTASSIUM: 4 mmol/L (ref 3.5–5.1)
SODIUM: 139 mmol/L (ref 135–145)

## 2014-07-12 LAB — APTT: aPTT: 37 seconds (ref 24–37)

## 2014-07-12 MED ORDER — SODIUM CHLORIDE 0.9 % IV SOLN: INTRAVENOUS | Status: DC

## 2014-07-12 MED ORDER — FENTANYL CITRATE 0.05 MG/ML IJ SOLN
INTRAMUSCULAR | Status: AC | PRN
  Administered 2014-07-12: 25 ug via INTRAVENOUS

## 2014-07-12 MED ORDER — FENTANYL CITRATE 0.05 MG/ML IJ SOLN
INTRAMUSCULAR | Status: AC
  Filled 2014-07-12: qty 2

## 2014-07-12 MED ORDER — SODIUM CHLORIDE 0.9 % IV SOLN: INTRAVENOUS | Status: AC

## 2014-07-12 MED ORDER — LIDOCAINE HCL 1 % IJ SOLN
INTRAMUSCULAR | Status: AC
  Filled 2014-07-12: qty 20

## 2014-07-12 MED ORDER — IOHEXOL 300 MG/ML  SOLN
150.0000 mL | Freq: Once | INTRAMUSCULAR | Status: AC | PRN
  Administered 2014-07-12: 70 mL via INTRA_ARTERIAL

## 2014-07-12 MED ORDER — MIDAZOLAM HCL 2 MG/2ML IJ SOLN
INTRAMUSCULAR | Status: AC
  Filled 2014-07-12: qty 4

## 2014-07-12 MED ORDER — HEPARIN SODIUM (PORCINE) 1000 UNIT/ML IJ SOLN
INTRAMUSCULAR | Status: AC | PRN
  Administered 2014-07-12: 1000 [IU] via INTRAVENOUS

## 2014-07-12 MED ORDER — MIDAZOLAM HCL 2 MG/2ML IJ SOLN
INTRAMUSCULAR | Status: AC | PRN
  Administered 2014-07-12: 1 mg via INTRAVENOUS

## 2014-07-12 MED ORDER — HEPARIN SOD (PORK) LOCK FLUSH 100 UNIT/ML IV SOLN
INTRAVENOUS | Status: AC
  Filled 2014-07-12: qty 20

## 2014-07-12 NOTE — Discharge Instructions (Signed)
Cerebral Angiogram, Care After °Refer to this sheet in the next few weeks. These instructions provide you with information on caring for yourself after your procedure. Your health care provider may also give you more specific instructions. Your treatment has been planned according to current medical practices, but problems sometimes occur. Call your health care provider if you have any problems or questions after your procedure. °WHAT TO EXPECT AFTER THE PROCEDURE °After your procedure, it is typical to have the following:  °· Small lump at the insertion site. °· Tenderness or bruising at the insertion site. °· Mild headache. °HOME CARE INSTRUCTIONS °· Rest at home for the first day after your procedure. °· Do not drive or operate heavy machinery as directed by your health care provider. °· Do not exercise as directed by your health care provider. °· Do not lift objects more than 10 pounds as directed by your health care provider. °· Take medicines only as directed by your health care provider. °· Follow your health care provider's instructions on: °¨ Incision care. °¨ Bandage (dressing) changes and removal. °· Check daily for signs of infection at the insertion site, such as redness, swelling, or discharge. °· Drink enough water to keep your urine clear or pale yellow. This will help flush out the contrast dye. °· Keep all follow-up visits as directed by your health care provider. This is important. °SEEK MEDICAL CARE IF: °· You have a fever. °· The skin at the insertion site begins to separate. °· You have drainage, redness, swelling, or pain at the insertion site. °· You are dizzy. °· You have a rash. °· You are itchy. °· You have difficulty urinating. °SEEK IMMEDIATE MEDICAL CARE IF: °· You have weakness in the face, arms, or legs.   °· You have difficulty talking or swallowing.   °· You have vision problems.   °· You have sudden swelling or pain at the insertion site. °· You have a rash.   °· You have  difficulty using the arm or leg in which the catheter was inserted.   °· You have chest pain. °· You have difficulty breathing. °Document Released: 11/08/2013 Document Reviewed: 07/07/2013 °[employer]® Patient Information ©2015 [employer]. This information is not intended to replace advice given to you by your health care provider. Make sure you discuss any questions you have with your health care provider. ° °

## 2014-07-12 NOTE — Procedures (Signed)
S/P 4 vessel cerebral arteriogram. RT CFA approach. Findings. 1.Approx 3mm x 2.8 mm LT ICA superior hypophyseal  Aneurysm  2.Approx 70 % stenosis of LT ICA supraclinoid stenosis. 3.Approx 65 to 70 % of LT PCA prox

## 2014-07-12 NOTE — H&P (Signed)
Chief Complaint: Dizziness 1 mo ago Abnormal MRI  Referring Physician(s): Alaysia Lightle K  History of Present Illness: Laura Woodard is a 81 y.o. female   Pt suffered dizziness and visual changes approx 1 mo ago Was seen and evaluated 06/13/14 MRI revealed infarct MRA 06/14/2014 shows L ICA stenosis with L ophthalmic artery aneurysm Consulted with Dr Corliss Skains 06/22/15--and now scheduled for cerebral arteriogram for further evaluation. Denies dizziness or visual changes now Has been using ASA 325 since initial episode  Past Medical History  Diagnosis Date  . Hypertension   . Hyperlipidemia   . Coronary artery disease     s/p CABG in November 2009  . GERD (gastroesophageal reflux disease)   . Headache(784.0)   . HOH (hard of hearing)   . Arthritis     Past Surgical History  Procedure Laterality Date  . Replacement total knee bilateral    . Cholecystectomy    . Coronary artery bypass graft  November 2009    LIMA to LAD, SVG to DX, SVG to 1st OM and SVG to RCA  . Cardiac catheterization  05/16/2008    EF 70+  . US echocardiography  05/12/2008    EF 55-60%  . Cardiovascular stress test  05/11/2008    EF 61%.ABNORMAL STRESS NUCLEAR STUDY. FINDINGS ARE CONSISTENT WITH ANTERIOR ISCHEMIA  . Abdominal hysterectomy    . Cataract extraction w/phaco Right 12/13/2013    Procedure: CATARACT EXTRACTION PHACO AND INTRAOCULAR LENS PLACEMENT (IOC);  Surgeon: Gemma Payor, MD;  Location: [facility];  Service: Ophthalmology;  Laterality: Right;  CDE 7.89  . Cataract extraction w/phaco Left 01/06/2014    Procedure: CATARACT EXTRACTION PHACO AND INTRAOCULAR LENS PLACEMENT (IOC);  Surgeon: Gemma Payor, MD;  Location: [facility];  Service: Ophthalmology;  Laterality: Left;  CDE:9.23    Allergies: Review of patient's allergies indicates no known allergies.  Medications: Prior to Admission medications   Medication Sig Start Date End Date Taking? Authorizing Provider  amLODipine (NORVASC) 5 MG  tablet Take 5 mg by mouth daily.     Yes Historical Provider, MD  aspirin EC 325 MG tablet Take 1 tablet (325 mg total) by mouth daily. 06/14/14  Yes Calvert Cantor, MD  atorvastatin (LIPITOR) 20 MG tablet Take 20 mg by mouth at bedtime.    Yes Historical Provider, MD  Cholecalciferol (VITAMIN D) 2000 UNITS tablet Take 2,000 Units by mouth daily.   Yes Historical Provider, MD  Ginkgo Biloba 120 MG CAPS Take 1 capsule by mouth daily.   Yes Historical Provider, MD  lisinopril-hydrochlorothiazide (PRINZIDE,ZESTORETIC) 20-12.5 MG per tablet Take 1 tablet by mouth daily.   Yes Historical Provider, MD  metoprolol succinate (TOPROL-XL) 50 MG 24 hr tablet TAKE 1 TABLET DAILY 06/17/14  Yes Peter M [place], MD  mirtazapine (REMERON) 15 MG tablet Take 15 mg by mouth at bedtime. 03/01/14  Yes Historical Provider, MD  Multiple Vitamin (MULTIVITAMIN) tablet Take 1 tablet by mouth daily.     Yes Historical Provider, MD  Omega-3 Fatty Acids (FISH OIL) 1200 MG CAPS Take 1 capsule by mouth daily.     Yes Historical Provider, MD  pantoprazole (PROTONIX) 40 MG tablet Take 40 mg by mouth daily.   Yes Historical Provider, MD  vitamin B-12 (CYANOCOBALAMIN) 1000 MCG tablet Take 1,000 mcg by mouth daily.    Yes Historical Provider, MD  acetaminophen (TYLENOL) 500 MG tablet Take 500 mg by mouth as needed for mild pain or headache.     Historical Provider, MD  metoprolol  succinate (TOPROL-XL) 50 MG 24 hr tablet Take 50 mg by mouth daily. Take with or immediately following a meal.    Historical Provider, MD  Simethicone (GAS-X PO) Take 1 tablet by mouth daily as needed. Indigestion     Historical Provider, MD  vitamin E 100 UNIT capsule Take 100 Units by mouth daily.    Historical Provider, MD    Family History  Problem Relation Age of Onset  . Lung cancer Mother   . Coronary artery disease Father     History   Social History  . Marital Status: Married    Spouse Name: N/A    Number of Children: N/A  . Years of  Education: N/A   Social History Main Topics  . Smoking status: Never Smoker   . Smokeless tobacco: None  . Alcohol Use: No  . Drug Use: No  . Sexual Activity: No   Other Topics Concern  . None   Social History Narrative     Review of Systems: A 12 point ROS discussed and pertinent positives are indicated in the HPI above.  All other systems are negative.  Review of Systems  Constitutional: Negative for activity change, appetite change and fatigue.  Eyes: Positive for visual disturbance. Negative for photophobia.       Resolved Left eye blurriness  Respiratory: Negative for chest tightness, shortness of breath and wheezing.   Cardiovascular: Negative for chest pain.  Gastrointestinal: Negative for abdominal pain.  Genitourinary: Negative for difficulty urinating.  Musculoskeletal: Negative for neck pain and neck stiffness.  Neurological: Positive for dizziness and light-headedness. Negative for tremors, seizures, syncope, facial asymmetry, speech difficulty, weakness, numbness and headaches.       All sxs resolved  Psychiatric/Behavioral: Negative for behavioral problems, confusion and agitation.    Vital Signs: BP 195/90 mmHg  Pulse 65  Temp(Src) 97.9 F (36.6 C) (Oral)  Resp 16  Ht  (1.575 m)  Wt 56.246 kg (124 lb)  BMI 22.67 kg/m2  SpO2 98%  Physical Exam  Constitutional: She is oriented to person, place, and time.  Neck: Normal range of motion.  Cardiovascular: Normal rate, regular rhythm and normal heart sounds.   No murmur heard. Pulmonary/Chest: Effort normal and breath sounds normal. She has no wheezes.  Abdominal: Soft. Bowel sounds are normal. There is no tenderness.  Musculoskeletal: Normal range of motion.  Neurological: She is alert and oriented to person, place, and time.  Skin: Skin is warm and dry.  Psychiatric: She has a normal mood and affect. Her behavior is normal. Judgment and thought content normal.  Nursing note and vitals  reviewed.   Imaging: Dg Chest 2 View  06/13/2014   CLINICAL DATA:  Dizziness and hypertension.  Visual disturbance.  EXAM: CHEST  2 VIEW  COMPARISON:  05/24/2008  FINDINGS: Heart size and pulmonary vascularity are normal. There is tortuosity and calcification of the thoracic aorta. Prior CABG.  The lungs are clear.  No effusions.  No acute osseous abnormalities.  IMPRESSION: No acute abnormalities.   Electronically Signed   By: Geanie Cooley M.D.   On: 06/13/2014 13:04   Ct Head Wo Contrast  06/13/2014   CLINICAL DATA:  Dizziness for several days.  EXAM: CT HEAD WITHOUT CONTRAST  TECHNIQUE: Contiguous axial images were obtained from the base of the skull through the vertex without intravenous contrast.  COMPARISON:  12/13/2011  FINDINGS: No mass lesion. No midline shift. No acute hemorrhage or hematoma. No extra-axial fluid collections. No evidence of  acute infarction. Brain parenchyma is normal. Osseous structures are normal except for minimal chronic mucosal thickening in the sphenoid sinus.  IMPRESSION: No significant abnormalities.   Electronically Signed   By: Geanie Cooley M.D.   On: 06/13/2014 14:12   Mr Maxine Glenn Head Wo Contrast  06/14/2014   CLINICAL DATA:  Punctate left occipital lobe infarct. Abnormal MRI the brain.  EXAM: MRA HEAD WITHOUT CONTRAST  TECHNIQUE: Angiographic images of the Circle of Willis were obtained using MRA technique without intravenous contrast.  COMPARISON:  MRI brain 06/13/2014.  FINDINGS: A 4 x 2 mm aneurysm is noted within the ophthalmic segment of the left internal carotid artery. The internal carotid arteries are otherwise within normal limits bilaterally. The A1 and M1 segments are normal. The anterior communicating artery is patent. The MCA bifurcations are intact. Distal MCA branch vessel attenuation is seen bilaterally without a proximal stenosis or occlusion.  The left vertebral artery is the dominant vessel. The left PICA origin is visualized and normal. AICA vessels  are seen bilaterally. The basilar artery is normal. The left posterior cerebral artery originates from the basilar tip. The right posterior cerebral artery is of fetal type. There is some attenuation of distal PCA branch vessels.  IMPRESSION: 1. 4 x 2 mm medial and inferior aneurysm at the left ophthalmic segment. 2. Mild to moderate distal small vessel disease without significant proximal stenosis or occlusion otherwise.   Electronically Signed   By: Gennette Pac M.D.   On: 06/14/2014 11:15   Mr Brain Wo Contrast (neuro Protocol)  06/13/2014   CLINICAL DATA:  Dizziness earlier today, since resolved.  EXAM: MRI HEAD WITHOUT CONTRAST  TECHNIQUE: Multiplanar, multiecho pulse sequences of the brain and surrounding structures were obtained without intravenous contrast.  COMPARISON:  Head CT 06/13/2014  FINDINGS: There is a 3 mm punctate focus of increased diffusion-weighted signal in the left occipital white matter without definite restricted diffusion on the ADC map, although evaluation is limited by its small size. There is no other evidence of acute infarct. Incidental note is made of a partially empty sella. Moderate pannus is noted posterior to the dens with slight deflection of the cervicomedullary junction.  There is no evidence of intracranial hemorrhage, mass, midline shift, or extra-axial fluid collection. There is mild generalized cerebral atrophy, within normal limits for age. Patchy T2 hyperintensities throughout the subcortical and deep cerebral white matter and pons are nonspecific but compatible with mild to moderate chronic small vessel ischemic disease. Dilated perivascular spaces are noted in the inferior basal ganglia. Small, chronic left cerebellar infarct is noted.  Prior bilateral cataract extraction is noted. Right sphenoid sinus mucosal thickening is noted. Mastoid air cells are clear. Major intracranial vascular flow voids are preserved.  IMPRESSION: 1. Suspected punctate, acute to  subacute left occipital infarct. 2. Mild to moderate chronic small vessel ischemic disease. 3. Small, chronic left cerebellar infarct.   Electronically Signed   By: Sebastian Ache   On: 06/13/2014 15:19   US Carotid Bilateral  06/14/2014   CLINICAL DATA:  Recent CVA  EXAM: BILATERAL CAROTID DUPLEX ULTRASOUND  TECHNIQUE: Wallace Cullens scale imaging, color Doppler and duplex ultrasound were performed of bilateral carotid and vertebral arteries in the neck.  COMPARISON:  None.  FINDINGS: Criteria: Quantification of carotid stenosis is based on velocity parameters that correlate the residual internal carotid diameter with NASCET-based stenosis levels, using the diameter of the distal internal carotid lumen as the denominator for stenosis measurement.  The following velocity measurements were  obtained:  RIGHT  ICA:  65/23 cm/sec  CCA:  57/12 cm/sec  SYSTOLIC ICA/CCA RATIO:  1.1  DIASTOLIC ICA/CCA RATIO:  1.9  ECA:  62 cm/sec  LEFT  ICA:  45/10 cm/sec  CCA:  60 T/8 cm/sec  SYSTOLIC ICA/CCA RATIO:  0.74  DIASTOLIC ICA/CCA RATIO:  1.3  ECA:  33 cm/sec  RIGHT CAROTID ARTERY: The grayscale images show mild plaque formation in the proximal internal carotid artery. The waveforms, velocities and flow velocity ratios however demonstrate no evidence of focal hemodynamically significant stenosis.  RIGHT VERTEBRAL ARTERY:  Antegrade in nature.  LEFT CAROTID ARTERY: Mild plaque formation is noted within the mid common carotid artery as well as proximal internal carotid artery. The waveforms, velocities and flow velocity ratios however demonstrate no evidence of focal hemodynamically significant stenosis.  LEFT VERTEBRAL ARTERY:  Antegrade in nature.  IMPRESSION: Mild plaque bilaterally without focal hemodynamically significant stenosis.   Electronically Signed   By: Alcide CleverMark  Lukens M.D.   On: 06/14/2014 12:37   Ir Radiologist Eval & Mgmt  06/22/2014   EXAM: NEW PATIENT OFFICE VISIT  CHIEF COMPLAINT: Abnormal MRA of the brain revealing  possible left internal carotid artery intracranial aneurysm.  Current Pain Level: 1-10  HISTORY OF PRESENT ILLNESS: The patient is an 81 year old the right-handed lady who has been referred for evaluation and management of a left internal carotid artery intracranial aneurysm.  The patient is accompanied by her son and daughter-in-law.  Briefly the patient was admitted a few days ago to [facility] with symptoms of vertigo, nausea and vomiting on 06/12/2014.  Subsequent workup involving an MRI of the brain revealed a left occipital punctate acute to subacute ischemic infarct. The patient was started on aspirin 325 mg a day and have management of her LDL. She was evaluated by physical therapy and occupational therapy who recommended home treatment.  She did have a 2D echocardiogram of her heart, unremarkable. She had an ejection fraction of 55-65%. The ultrasound of the carotids was unremarkable as well. Since her discharge, the patient reports no new symptoms. She walks with a cane for her protection. However, she is able to ambulate independently on her own. She is able to take care of herself in terms of daily activities on her own.  Past Medical History: Arthritis. Heart surgery requiring 4 bypasses. High blood pressure. High cholesterol. Reflux, indigestion. Stroke as mentioned above.  Past Surgical History: Four vessel bypass surgery. Cholecystectomy. Hysterectomy. Cataracts. Bilateral knee replacements.  Medications: Aspirin 325 mg a day. Tylenol. Norvasc. Lipitor. Lisinopril. Hydrochlorothiazide combination. Metoprolol. Remeron. Multivitamins. Omega 3 fatty acids. Protonix. Simethicone. Vitamin B12 and vitamin-E.  Review of Systems: Essentially negative for pathologic symptomatology.  PHYSICAL EXAMINATION: In no acute distress. Affect normal. Neurologically alert, awake and oriented to time, place, space. Speech and comprehension within normal limits. No lateralizing neurological features in terms of  cranial abnormalities or motor or sensory abnormalities. Station gait not tested.  ASSESSMENT AND PLAN: The patient's recent MRI and MRA scans of the brain were are reviewed with patient and the patient's family. These revealed the presence of nonspecific subcortical white matter changes probably related to chronic microvascular ischemia. Also seen is a punctate focal area of diffusion positivity in the left occipital region subcortical adjacent to the occipital horn of the left lateral ventricle. Mild atrophy is also appreciated.  The MRA reveals significantly decreased caliber of the left internal carotid artery in the distal cavernous/supraclinoid segments associated with a bilobed saccular outpouching. This measures  approximately 4.5 mm x 2.5 mm. Also seen is a severely decreased caliber of the internal carotid artery in the supraclinoid segment proximally.  The patient and her family were informed that for a clear idea about the exact anatomy in this region, a formal catheter angiogram would be appropriate. If the narrowing is significantly severe this could be the reason for her ischemic infarct involving the left cerebral hemisphere. Additionally, the angio architecture of the aneurysm could be evaluated by the arteriogram.  The procedure, the risks, the benefits and alternatives were all reviewed with the patient and the patient's family. Risk of stroke of less than 1 in a thousand was discussed with the family and the patient.  Questions were answered to their satisfaction. In the meantime, the patient has been asked to continue taking her aspirin as prescribed in addition to her other medications especially cholesterol lowering medications, and high blood pressure medication to reduce the risk of future ischemic stroke.  The patient is inclined to proceed with the catheter angiogram. However, a final decision will be made after further discussion with the family at home.  They will let us know about their  final decisions. In the meantime they were asked to call should they have any concerns or questions.   Electronically Signed   By: Julieanne Cotton M.D.   On: 06/21/2014 16:38    Labs:  CBC:  Recent Labs  12/06/13 1100 03/05/14 0957 06/13/14 1115 07/12/14 0649  WBC  --  8.0 6.3 6.1  HGB 11.8* 11.6* 11.7* 12.1  HCT 36.5 35.8* 36.2 38.2  PLT  --  256 224 240    COAGS:  Recent Labs  07/12/14 0649  INR 1.02  APTT 37    BMP:  Recent Labs  12/06/13 1100 03/05/14 0957 06/13/14 1115 07/12/14 0649  NA 140 138 139 139  K 4.9 4.3 4.6 4.0  CL 101 102 101 104  CO2 GLUCOSE 98 87 109* 102*  BUN 21 26* 21 24*  CALCIUM 10.1 9.5 10.0 9.9  CREATININE 0.84 0.82 0.78 1.01  GFRNONAA 61* 63* 73* 48*  GFRAA 70* 72* 84* 56*    LIVER FUNCTION TESTS:  Recent Labs  06/13/14 1115  BILITOT 0.4  AST 23  ALT 12  ALKPHOS 86  PROT 7.4  ALBUMIN 3.7    TUMOR MARKERS: No results for input(s): AFPTM, CEA, CA199, CHROMGRNA in the last 8760 hours.  Assessment and Plan:  Dizziness; N/V approx 1 mo ago Now resolved Work up revealed infarct and L ICA aneurysm Now scheduled for cerebral arteriogram for evaluation Pt and family aware of procedure benefits and risks and agreeable to proceed Consent signed andin chart  Thank you for this interesting consult.  I greatly enjoyed meeting Laura Woodard and look forward to participating in their care.    I spent a total of 20 minutes face to face in clinical consultation, greater than 50% of which was counseling/coordinating care for cerebral arteriogram  Signed: TURPIN,PAMELA A 07/12/2014, 8:40 AM

## 2014-10-12 ENCOUNTER — Encounter: Payer: Self-pay | Admitting: Nurse Practitioner

## 2014-10-12 ENCOUNTER — Ambulatory Visit (INDEPENDENT_AMBULATORY_CARE_PROVIDER_SITE_OTHER): Payer: Medicare HMO | Admitting: Nurse Practitioner

## 2014-10-12 VITALS — BP 142/74 | HR 82 | Resp 16 | Ht 62.0 in | Wt 125.0 lb

## 2014-10-12 DIAGNOSIS — I10 Essential (primary) hypertension: Secondary | ICD-10-CM

## 2014-10-12 DIAGNOSIS — I639 Cerebral infarction, unspecified: Secondary | ICD-10-CM | POA: Diagnosis not present

## 2014-10-12 DIAGNOSIS — I259 Chronic ischemic heart disease, unspecified: Secondary | ICD-10-CM | POA: Diagnosis not present

## 2014-10-12 DIAGNOSIS — I2510 Atherosclerotic heart disease of native coronary artery without angina pectoris: Secondary | ICD-10-CM | POA: Diagnosis not present

## 2014-10-12 NOTE — Patient Instructions (Signed)
Stay on your current medicines  I will see you in one year  Stay safe!  Call the [facility] office at [phone] if you have any questions, problems or concerns.

## 2014-10-12 NOTE — Progress Notes (Signed)
CARDIOLOGY OFFICE NOTE  Date:  10/12/2014    [name] Date of Birth: Mar 09, 1924 Medical Record #[id number]  PCP:  Josue Hector, MD  Cardiologist:  [place]    Chief Complaint  Patient presents with  . Coronary Artery Disease    1 year follow up - seen for Dr. [place]     History of Present Illness: Laura Woodard is a 81 y.o. female who presents today for a 1 year check. Seen for Dr. [place].  She has known CAD with remote CABG in 2009, HTN and HLD.   Seen back in May of 2014 after going to [facility] for syncope in April of 2014.  Records were reviewed and it was felt that she had a vasovagal faint and was probably volume depleted, over heated from sitting under a hair dryer and had a UTI. Did not get a CT of her head, nor carotid dopplers but had a CTA of the chest due to elevated d dimer and that was negative. Potassium was 3.2. I cut her HCTZ out of her Lisinopril and placed her on plain Lisinopril. Also she had stopped her Zoloft and did not wish to resume. She had been given some Ambien as well to help her sleep.   Last seen by me back last April. She was doing ok.   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 Fernande Bras - to be followed. Now on full dose aspirin.   Comes back today. Here with her son. She says she is doing ok. "Does not feel well all the time but what do you expect at my age". No chest pain. Some palpitations. Breathing ok. No falls. No syncope reported.   Past Medical History  Diagnosis Date  . Hypertension   . Hyperlipidemia   . Coronary artery disease     s/p CABG in November 2009  . GERD (gastroesophageal reflux disease)   . Headache(784.0)   . HOH (hard of hearing)   . Arthritis     Past Surgical History  Procedure Laterality Date  . Replacement total knee bilateral    . Cholecystectomy    . Coronary artery bypass graft   November 2009    LIMA to LAD, SVG to DX, SVG to 1st OM and SVG to RCA  . Cardiac catheterization  05/16/2008    EF 70+  . US echocardiography  05/12/2008    EF 55-60%  . Cardiovascular stress test  05/11/2008    EF 61%.ABNORMAL STRESS NUCLEAR STUDY. FINDINGS ARE CONSISTENT WITH ANTERIOR ISCHEMIA  . Abdominal hysterectomy    . Cataract extraction w/phaco Right 12/13/2013    Procedure: CATARACT EXTRACTION PHACO AND INTRAOCULAR LENS PLACEMENT (IOC);  Surgeon: Gemma Payor, MD;  Location: [facility];  Service: Ophthalmology;  Laterality: Right;  CDE 7.89  . Cataract extraction w/phaco Left 01/06/2014    Procedure: CATARACT EXTRACTION PHACO AND INTRAOCULAR LENS PLACEMENT (IOC);  Surgeon: Gemma Payor, MD;  Location: [facility];  Service: Ophthalmology;  Laterality: Left;  CDE:9.23     Medications: Current Outpatient Prescriptions  Medication Sig Dispense Refill  . acetaminophen (TYLENOL) 500 MG tablet Take 500 mg by mouth as needed for mild pain or headache.     Marland Kitchen amLODipine (NORVASC) 5 MG tablet Take 5 mg by mouth daily.      Marland Kitchen aspirin EC 325 MG tablet Take 1 tablet (325 mg total) by mouth  daily. 30 tablet 0  . atorvastatin (LIPITOR) 20 MG tablet Take 20 mg by mouth at bedtime.     . Cholecalciferol (VITAMIN D) 2000 UNITS tablet Take 2,000 Units by mouth daily.    . Ginkgo Biloba 120 MG CAPS Take 1 capsule by mouth daily.    Marland Kitchen. lisinopril (PRINIVIL,ZESTRIL) 20 MG tablet Take 20 mg by mouth.    . metoprolol succinate (TOPROL-XL) 50 MG 24 hr tablet TAKE 1 TABLET DAILY 30 tablet 6  . mirtazapine (REMERON) 15 MG tablet Take 15 mg by mouth at bedtime.    . Multiple Vitamin (MULTIVITAMIN) tablet Take 1 tablet by mouth daily.      . Omega-3 Fatty Acids (FISH OIL) 1200 MG CAPS Take 1 capsule by mouth daily.      . pantoprazole (PROTONIX) 40 MG tablet Take 40 mg by mouth daily.    . Simethicone (GAS-X PO) Take 1 tablet by mouth daily as needed. Indigestion     . vitamin B-12 (CYANOCOBALAMIN) 1000 MCG tablet Take  1,000 mcg by mouth daily.     . vitamin E 100 UNIT capsule Take 100 Units by mouth daily.    Marland Kitchen. zolpidem (AMBIEN) 5 MG tablet Take 1 tablet by mouth.    Marland Kitchen. lisinopril-hydrochlorothiazide (PRINZIDE,ZESTORETIC) 20-12.5 MG per tablet Take 1 tablet by mouth daily.     No current facility-administered medications for this visit.    Allergies: No Known Allergies  Social History: The patient  reports that she has never smoked. She does not have any smokeless tobacco history on file. She reports that she does not drink alcohol or use illicit drugs.   Family History: The patient's family history includes Coronary artery disease in her father; Lung cancer in her mother.   Review of Systems: Please see the history of present illness.   Otherwise, the review of systems is positive for appetite change, easy bruising, headaches, leg pain, fatigue and some skipped heart beats.   All other systems are reviewed and negative.   Physical Exam: VS:  BP 142/74 mmHg  Pulse 82  Resp 16  Ht 5\' 2"  (1.575 m)  Wt 125 lb (56.7 kg)  BMI 22.86 kg/m2  SpO2 99% .  BMI Body mass index is 22.86 kg/(m^2).  Wt Readings from Last 3 Encounters:  10/12/14 125 lb (56.7 kg)  06/13/14 124 lb (56.246 kg)  06/14/14 124 lb (56.246 kg)    General: Pleasant. Well developed, well nourished and in no acute distress.  HEENT: Normal. Neck: Supple, no JVD, carotid bruits, or masses noted.  Cardiac: Irregular rhythm today. Rate is ok. No edema.  Respiratory:  Lungs are clear to auscultation bilaterally with normal work of breathing.  GI: Soft and nontender.  MS: No deformity or atrophy. Gait and ROM intact. Skin: Warm and dry. Color is normal.  Neuro:  Strength and sensation are intact and no gross focal deficits noted.  Psych: Alert, appropriate and with normal affect.   LABORATORY DATA:  EKG:  EKG is ordered today. EKG today shows NSR with a PVC. She is NOT in AF.   Lab Results  Component Value Date   WBC 6.1  07/12/2014   HGB 12.1 07/12/2014   HCT 38.2 07/12/2014   PLT 240 07/12/2014   GLUCOSE 102* 07/12/2014   CHOL 120 06/14/2014   TRIG 143 06/14/2014   HDL 46 06/14/2014   LDLCALC 45 06/14/2014   ALT 12 06/13/2014   AST 23 06/13/2014   NA 139 07/12/2014  K 4.0 07/12/2014   CL 104 07/12/2014   CREATININE 1.01 07/12/2014   BUN 24* 07/12/2014   CO2 28 07/12/2014   INR 1.02 07/12/2014   HGBA1C 6.0* 06/13/2014    BNP (last 3 results) No results for input(s): BNP in the last 8760 hours.  ProBNP (last 3 results) No results for input(s): PROBNP in the last 8760 hours.   Other Studies Reviewed Today:  Echo Study Conclusions from 06/2014  - Left ventricle: The cavity size was normal. Systolic function was normal. The estimated ejection fraction was in the range of 60% to 65%. Wall motion was normal; there were no regional wall motion abnormalities. Doppler parameters are consistent with abnormal left ventricular relaxation (grade 1 diastolic dysfunction). Doppler parameters are consistent with high ventricular filling pressure. Mild concentric and moderate focal basal septal hypertrophy. - Aortic valve: Mildly calcified annulus. Trileaflet; mildly thickened, mildly calcified leaflets. There was mild regurgitation. - Aorta: Mild ascending aortic dilatation. Maximum diameter 3.67 cm. - Mitral valve: There was mild to moderate regurgitation. - Left atrium: The atrium was mildly to moderately dilated. - Right atrium: The atrium was mildly to moderately dilated. - Tricuspid valve: There was mild-moderate regurgitation. - Pulmonary arteries: PA peak pressure: 35 mm Hg (S). Mildly elevated pulmonary pressures.  Assessment/Plan: 1. Recent stroke noted in December - now on full dose aspirin.  2. Incidental aneurysm of the left carotid - to be followed.  3. HTN   4. Prior syncope - without recurrence.   5. CAD - prior CABG in 2009 - no symptoms reported.  Conservative management. Will check EKG today -  This is ok.   Overall she seems to be holding her own.   Current medicines are reviewed with the patient today.  The patient does not have concerns regarding medicines other than what has been noted above.  The following changes have been made:  See above.  Labs/ tests ordered today include:   No orders of the defined types were placed in this encounter.    Disposition:   FU with me in 1 year.  Patient is agreeable to this plan and will call if any problems develop in the interim.   Signed: Rosalio Macadamia, RN, ANP-C 10/12/2014 11:12 AM  [facility] [address] [place], [place]  [address] Phone: [phone] Fax: [phone]

## 2014-11-15 ENCOUNTER — Other Ambulatory Visit: Payer: Self-pay | Admitting: Nurse Practitioner

## 2014-11-16 NOTE — Telephone Encounter (Signed)
She should be on lisinopril only 20 mg daily. Not lisinopril HCT  Peter [place]az[place]Jordan MD, [facility]

## 2014-11-17 NOTE — Telephone Encounter (Signed)
Patient called no answer.Advised to call me back Dr.Jordan advised to take lisinopril 20 mg daily and not lisinopril/hct.

## 2014-11-18 ENCOUNTER — Telehealth: Payer: Self-pay

## 2014-11-18 MED ORDER — LISINOPRIL 20 MG PO TABS: 20.0000 mg | ORAL_TABLET | Freq: Every day | ORAL | Status: DC

## 2014-11-18 NOTE — Telephone Encounter (Signed)
Spoke to patient she is taking Lisinopril 20 mg daily.Refill sent to pharmacy.

## 2015-01-02 ENCOUNTER — Other Ambulatory Visit: Payer: Self-pay

## 2015-01-13 ENCOUNTER — Other Ambulatory Visit: Payer: Self-pay | Admitting: Cardiology

## 2015-01-13 NOTE — Telephone Encounter (Signed)
Rx has been sent to the pharmacy electronically. ° °

## 2015-01-26 ENCOUNTER — Telehealth (HOSPITAL_COMMUNITY): Payer: Self-pay | Admitting: Interventional Radiology

## 2015-01-26 NOTE — Telephone Encounter (Signed)
Called pt's husband, told him that patient's insurance has denied the MRI/MRA authorization request. I told him that I would speak to Dr. Corliss Skains and see what he would like to do now for follow-up for Ms. Purdie. We discussed that he may recommend a cerebral catheter angiogram and the pt's husband stated that if that were the case that would be fine. He expressed understanding and was in agreement with this plan of care. JM

## 2015-02-06 ENCOUNTER — Other Ambulatory Visit (HOSPITAL_COMMUNITY): Payer: Self-pay | Admitting: Interventional Radiology

## 2015-02-06 ENCOUNTER — Telehealth (HOSPITAL_COMMUNITY): Payer: Self-pay | Admitting: Interventional Radiology

## 2015-02-06 DIAGNOSIS — I671 Cerebral aneurysm, nonruptured: Secondary | ICD-10-CM

## 2015-02-06 NOTE — Telephone Encounter (Signed)
Called pt's husband and discussed that his wife's insurance company denied her MRI/MRA and that Dr. Corliss Skains wanted to schedule for her f/u. We are going to do a cerebral angiogram and the patient and her husband are in agreement to proceed with this instead. I told him I would mail them written instructions for the procedure. They were happy with that. JM

## 2015-02-20 ENCOUNTER — Other Ambulatory Visit: Payer: Self-pay | Admitting: Radiology

## 2015-02-21 ENCOUNTER — Ambulatory Visit (HOSPITAL_COMMUNITY)
Admission: RE | Admit: 2015-02-21 | Discharge: 2015-02-21 | Disposition: A | Discharge status: Home / Self Care | Payer: Medicare HMO | Source: Ambulatory Visit | Attending: Interventional Radiology | Admitting: Interventional Radiology

## 2015-02-21 ENCOUNTER — Encounter (HOSPITAL_COMMUNITY): Payer: Self-pay

## 2015-02-21 ENCOUNTER — Other Ambulatory Visit (HOSPITAL_COMMUNITY): Payer: Self-pay | Admitting: Interventional Radiology

## 2015-02-21 DIAGNOSIS — I10 Essential (primary) hypertension: Secondary | ICD-10-CM | POA: Diagnosis not present

## 2015-02-21 DIAGNOSIS — I671 Cerebral aneurysm, nonruptured: Secondary | ICD-10-CM | POA: Insufficient documentation

## 2015-02-21 DIAGNOSIS — I6521 Occlusion and stenosis of right carotid artery: Secondary | ICD-10-CM | POA: Insufficient documentation

## 2015-02-21 DIAGNOSIS — I2510 Atherosclerotic heart disease of native coronary artery without angina pectoris: Secondary | ICD-10-CM | POA: Insufficient documentation

## 2015-02-21 DIAGNOSIS — K219 Gastro-esophageal reflux disease without esophagitis: Secondary | ICD-10-CM | POA: Diagnosis not present

## 2015-02-21 DIAGNOSIS — Z7982 Long term (current) use of aspirin: Secondary | ICD-10-CM | POA: Diagnosis not present

## 2015-02-21 DIAGNOSIS — G450 Vertebro-basilar artery syndrome: Secondary | ICD-10-CM | POA: Insufficient documentation

## 2015-02-21 DIAGNOSIS — Z951 Presence of aortocoronary bypass graft: Secondary | ICD-10-CM | POA: Insufficient documentation

## 2015-02-21 DIAGNOSIS — M1990 Unspecified osteoarthritis, unspecified site: Secondary | ICD-10-CM | POA: Insufficient documentation

## 2015-02-21 DIAGNOSIS — Z96653 Presence of artificial knee joint, bilateral: Secondary | ICD-10-CM | POA: Insufficient documentation

## 2015-02-21 DIAGNOSIS — E785 Hyperlipidemia, unspecified: Secondary | ICD-10-CM | POA: Diagnosis not present

## 2015-02-21 LAB — BASIC METABOLIC PANEL
Anion gap: 10 (ref 5–15)
BUN: 23 mg/dL — ABNORMAL HIGH (ref 6–20)
CALCIUM: 9.6 mg/dL (ref 8.9–10.3)
CO2: 22 mmol/L (ref 22–32)
CREATININE: 1.06 mg/dL — AB (ref 0.44–1.00)
Chloride: 103 mmol/L (ref 101–111)
GFR, EST AFRICAN AMERICAN: 53 mL/min — AB (ref >60)
GFR, EST NON AFRICAN AMERICAN: 45 mL/min — AB (ref >60)
Glucose, Bld: 99 mg/dL (ref 65–99)
Potassium: 4.1 mmol/L (ref 3.5–5.1)
SODIUM: 135 mmol/L (ref 135–145)

## 2015-02-21 LAB — CBC WITH DIFFERENTIAL/PLATELET
BASOS ABS: 0 10*3/uL (ref 0.0–0.1)
BASOS PCT: 1 % (ref 0–1)
EOS ABS: 0.2 10*3/uL (ref 0.0–0.7)
Eosinophils Relative: 3 % (ref 0–5)
HEMATOCRIT: 32.6 % — AB (ref 36.0–46.0)
HEMOGLOBIN: 10.6 g/dL — AB (ref 12.0–15.0)
Lymphocytes Relative: 30 % (ref 12–46)
Lymphs Abs: 1.6 10*3/uL (ref 0.7–4.0)
MCH: 29.8 pg (ref 26.0–34.0)
MCHC: 32.5 g/dL (ref 30.0–36.0)
MCV: 91.6 fL (ref 78.0–100.0)
Monocytes Absolute: 0.5 10*3/uL (ref 0.1–1.0)
Monocytes Relative: 10 % (ref 3–12)
NEUTROS PCT: 56 % (ref 43–77)
Neutro Abs: 2.9 10*3/uL (ref 1.7–7.7)
Platelets: 204 10*3/uL (ref 150–400)
RBC: 3.56 MIL/uL — AB (ref 3.87–5.11)
RDW: 13.4 % (ref 11.5–15.5)
WBC: 5.2 10*3/uL (ref 4.0–10.5)

## 2015-02-21 LAB — APTT: aPTT: 34 seconds (ref 24–37)

## 2015-02-21 LAB — PROTIME-INR
INR: 1.07 (ref 0.00–1.49)
PROTHROMBIN TIME: 14.1 s (ref 11.6–15.2)

## 2015-02-21 MED ORDER — LIDOCAINE HCL 1 % IJ SOLN
INTRAMUSCULAR | Status: AC
  Filled 2015-02-21: qty 20

## 2015-02-21 MED ORDER — SODIUM CHLORIDE 0.9 % IV SOLN: INTRAVENOUS | Status: AC

## 2015-02-21 MED ORDER — MIDAZOLAM HCL 2 MG/2ML IJ SOLN
INTRAMUSCULAR | Status: DC | PRN
  Administered 2015-02-21: 1 mg via INTRAVENOUS

## 2015-02-21 MED ORDER — IOHEXOL 300 MG/ML  SOLN
150.0000 mL | Freq: Once | INTRAMUSCULAR | Status: DC | PRN
  Administered 2015-02-21: 72 mL via INTRAVENOUS
  Filled 2015-02-21: qty 150

## 2015-02-21 MED ORDER — FENTANYL CITRATE (PF) 100 MCG/2ML IJ SOLN
INTRAMUSCULAR | Status: AC
  Filled 2015-02-21: qty 2

## 2015-02-21 MED ORDER — HEPARIN SODIUM (PORCINE) 1000 UNIT/ML IJ SOLN
INTRAMUSCULAR | Status: AC
Start: 2015-02-21 — End: 2015-02-21
  Filled 2015-02-21: qty 1

## 2015-02-21 MED ORDER — MIDAZOLAM HCL 2 MG/2ML IJ SOLN
INTRAMUSCULAR | Status: AC
  Filled 2015-02-21: qty 2

## 2015-02-21 MED ORDER — FENTANYL CITRATE (PF) 100 MCG/2ML IJ SOLN
INTRAMUSCULAR | Status: DC | PRN
  Administered 2015-02-21: 25 ug via INTRAVENOUS

## 2015-02-21 MED ORDER — HEPARIN SODIUM (PORCINE) 1000 UNIT/ML IJ SOLN
1000.0000 [IU] | Freq: Once | INTRAMUSCULAR | Status: AC
Start: 2015-02-21 — End: 2015-02-21
  Administered 2015-02-21: 1000 [IU] via INTRAVENOUS

## 2015-02-21 MED ORDER — SODIUM CHLORIDE 0.9 % IV SOLN
INTRAVENOUS | Status: DC
  Administered 2015-02-21: 07:00:00 via INTRAVENOUS

## 2015-02-21 NOTE — Sedation Documentation (Signed)
Patient is resting comfortably. 

## 2015-02-21 NOTE — Procedures (Signed)
S/P bilateral common carotid arteriograms and Rt Vert artery angiogram. RT CFA approach. Findings. 1. Approx 4mm x 3.5 mm Lt superior hypophyseal aneurysm  2.Approx 40 to 50 % stenosis of LT ICA supraclinoid

## 2015-02-21 NOTE — H&P (Signed)
Chief Complaint: Patient was seen in consultation today for cerebral aneurysm   Referring Physician(s): PCP Dr. Lysbeth Galas  History of Present Illness: Laura Woodard is a 81 y.o. female with history of CVA in 06/2014 with follow up arteriogram 07/2014 that revealed left internal carotid artery intracranial aneurysm. She presents today for follow-up cerebral arteriogram. She denies any headaches, change in vision or speech. She denies any tinnitus, extremity weakness or any new neurological symptoms. She denies any chest pain, shortness of breath or palpitations. She denies any active signs of bleeding or excessive bruising. She denies any recent fever or chills. The patient denies any history of sleep apnea or chronic oxygen use. She has previously tolerated sedation and iodinated contrast without complications.    Past Medical History  Diagnosis Date  . Hypertension   . Hyperlipidemia   . Coronary artery disease     s/p CABG in November 2009  . GERD (gastroesophageal reflux disease)   . Headache(784.0)   . HOH (hard of hearing)   . Arthritis     Past Surgical History  Procedure Laterality Date  . Replacement total knee bilateral    . Cholecystectomy    . Coronary artery bypass graft  November 2009    LIMA to LAD, SVG to DX, SVG to 1st OM and SVG to RCA  . Cardiac catheterization  05/16/2008    EF 70+  . US echocardiography  05/12/2008    EF 55-60%  . Cardiovascular stress test  05/11/2008    EF 61%.ABNORMAL STRESS NUCLEAR STUDY. FINDINGS ARE CONSISTENT WITH ANTERIOR ISCHEMIA  . Abdominal hysterectomy    . Cataract extraction w/phaco Right 12/13/2013    Procedure: CATARACT EXTRACTION PHACO AND INTRAOCULAR LENS PLACEMENT (IOC);  Surgeon: Gemma Payor, MD;  Location: [facility];  Service: Ophthalmology;  Laterality: Right;  CDE 7.89  . Cataract extraction w/phaco Left 01/06/2014    Procedure: CATARACT EXTRACTION PHACO AND INTRAOCULAR LENS PLACEMENT (IOC);  Surgeon: Gemma Payor, MD;   Location: [facility];  Service: Ophthalmology;  Laterality: Left;  CDE:9.23    Allergies: Review of patient's allergies indicates no known allergies.  Medications: Prior to Admission medications   Medication Sig Start Date End Date Taking? Authorizing Provider  acetaminophen (TYLENOL) 500 MG tablet Take 500 mg by mouth as needed for mild pain or headache.    Yes Historical Provider, MD  amLODipine (NORVASC) 5 MG tablet Take 5 mg by mouth daily.     Yes Historical Provider, MD  aspirin EC 325 MG tablet Take 1 tablet (325 mg total) by mouth daily. 06/14/14  Yes Calvert Cantor, MD  atorvastatin (LIPITOR) 20 MG tablet Take 20 mg by mouth at bedtime.    Yes Historical Provider, MD  Cholecalciferol (VITAMIN D) 2000 UNITS tablet Take 2,000 Units by mouth daily.   Yes Historical Provider, MD  Ginkgo Biloba 120 MG CAPS Take 1 capsule by mouth daily.   Yes Historical Provider, MD  levothyroxine (SYNTHROID, LEVOTHROID) 75 MCG tablet Take 75 mcg by mouth daily before breakfast.   Yes Historical Provider, MD  lisinopril (PRINIVIL,ZESTRIL) 20 MG tablet Take 1 tablet (20 mg total) by mouth daily. 11/18/14  Yes Peter M [place], MD  metoprolol succinate (TOPROL-XL) 50 MG 24 hr tablet TAKE 1 TABLET DAILY 01/13/15  Yes Peter M [place], MD  mirtazapine (REMERON) 15 MG tablet Take 15 mg by mouth at bedtime. 03/01/14  Yes Historical Provider, MD  Multiple Vitamin (MULTIVITAMIN) tablet Take 1 tablet by mouth daily.  Yes Historical Provider, MD  Omega-3 Fatty Acids (FISH OIL) 1200 MG CAPS Take 1 capsule by mouth daily.     Yes Historical Provider, MD  pantoprazole (PROTONIX) 40 MG tablet Take 40 mg by mouth daily.   Yes Historical Provider, MD  Simethicone (GAS-X PO) Take 1 tablet by mouth daily as needed. Indigestion    Yes Historical Provider, MD  vitamin B-12 (CYANOCOBALAMIN) 1000 MCG tablet Take 1,000 mcg by mouth daily.    Yes Historical Provider, MD  vitamin E 100 UNIT capsule Take 100 Units by mouth daily.   Yes  Historical Provider, MD  zolpidem (AMBIEN) 5 MG tablet Take 1 tablet by mouth at bedtime as needed for sleep.  10/10/14  Yes Historical Provider, MD     Family History  Problem Relation Age of Onset  . Lung cancer Mother   . Coronary artery disease Father     Social History   Social History  . Marital Status: Married    Spouse Name: N/A  . Number of Children: N/A  . Years of Education: N/A   Social History Main Topics  . Smoking status: Never Smoker   . Smokeless tobacco: None  . Alcohol Use: No  . Drug Use: No  . Sexual Activity: No   Other Topics Concern  . None   Social History Narrative    Review of Systems: A 12 point ROS discussed and pertinent positives are indicated in the HPI above.  All other systems are negative.  Review of Systems  Vital Signs: BP 171/82 mmHg  Pulse 77  Temp(Src) 97.9 F (36.6 C) (Oral)  Resp 16  Ht 5\' 2"  (1.575 m)  Wt 124 lb (56.246 kg)  BMI 22.67 kg/m2  SpO2 100%  Physical Exam  Constitutional: She is oriented to person, place, and time. No distress.  HENT:  Head: Normocephalic and atraumatic.  Neck: No tracheal deviation present.  Cardiovascular: Intact distal pulses.  Exam reveals no gallop and no friction rub.   No murmur heard. IRR IRR  Pulmonary/Chest: Effort normal and breath sounds normal. No respiratory distress. She has no wheezes. She has no rales.  Abdominal: Soft. Bowel sounds are normal. She exhibits no distension. There is no tenderness.  Neurological: She is alert and oriented to person, place, and time.  Speech clear, tongue midline, equal strength upper and lower ext 5/5  Skin: She is not diaphoretic.  Psychiatric: She has a normal mood and affect. Her behavior is normal. Thought content normal.    Mallampati Score:  MD Evaluation Airway: WNL Heart: WNL Abdomen: WNL Chest/ Lungs: WNL ASA  Classification: 3 Mallampati/Airway Score: Two  Imaging: No results found.  Labs:  CBC:  Recent Labs   03/05/14 0957 06/13/14 1115 07/12/14 0649 02/21/15 0659  WBC 8.0 6.3 6.1 5.2  HGB 11.6* 11.7* 12.1 10.6*  HCT 35.8* 36.2 38.2 32.6*  PLT 256 224 240 204    COAGS:  Recent Labs  07/12/14 0649 02/21/15 0659  INR 1.02 1.07  APTT 37 34    BMP:  Recent Labs  03/05/14 0957 06/13/14 1115 07/12/14 0649 02/21/15 0659  NA 138 139 139 135  K 4.3 4.6 4.0 4.1  CL 102 101 104 103  CO2 26 25 28 22   GLUCOSE 87 109* 102* 99  BUN 26* 21 24* 23*  CALCIUM 9.5 10.0 9.9 9.6  CREATININE 0.82 0.78 1.01 1.06*  GFRNONAA 63* 73* 48* 45*  GFRAA 72* 84* 56* 53*    LIVER FUNCTION TESTS:  Recent Labs  06/13/14 1115  BILITOT 0.4  AST 23  ALT 12  ALKPHOS 86  PROT 7.4  ALBUMIN 3.7    Assessment and Plan: History of CVA Left internal carotid artery intracranial aneurysm s/p diagnostic arteriogram 07/2014, asymptomatic and remains on BP medication and ASA  daily Scheduled today for cerebral arteriogram The patient has been NPO, no blood thinners taken, labs and vitals have been reviewed. Risks and Benefits discussed with the patient including, but not limited to bleeding, infection, vascular injury or contrast induced renal failure. All of the patient's questions were answered, patient is agreeable to proceed. Consent signed and in chart. CAD s/p CABG, asymptomatic HTN HLP GERD   Thank you for this interesting consult.  I greatly enjoyed meeting DILAN FULLENWIDER and look forward to participating in their care.  A copy of this report was sent to the requesting provider on this date.  SignedBerneta Levins 02/21/2015, 8:09 AM   I spent a total of 30 Minutes in face to face in clinical consultation, greater than 50% of which was counseling/coordinating care for cerebral aneurysm.

## 2015-02-21 NOTE — Discharge Instructions (Signed)

## 2015-06-05 IMAGING — XA IR ANGIO INTRA EXTRACRAN SEL COM CAROTID INNOMINATE BILAT MOD SE
2 of 4 series · 12 of 24 positions shown · IV contrast (IODINE)
Comparison: none

CLINICAL DATA: Left cerebral hemispheric ischemic stroke. Left
internal carotid artery intracranial aneurysm.

[Series 12: carotid 1 · 1 of 2 slices shown]
[im 1/2]
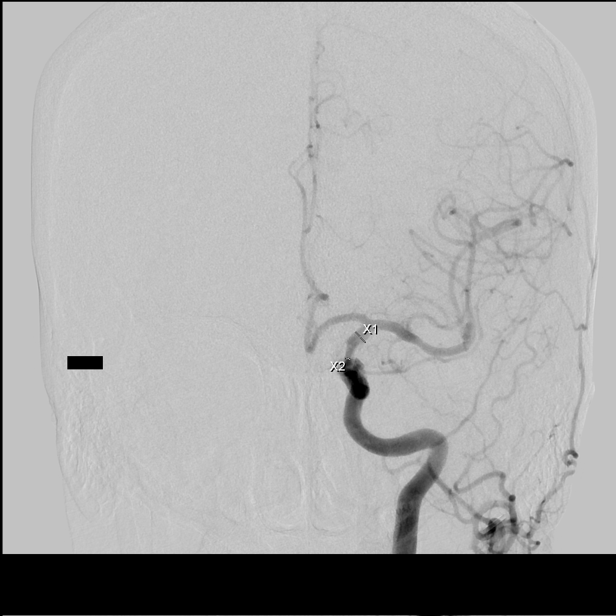

[Series 300: neuro · 11 of 186 slices shown]
[im 1/186]
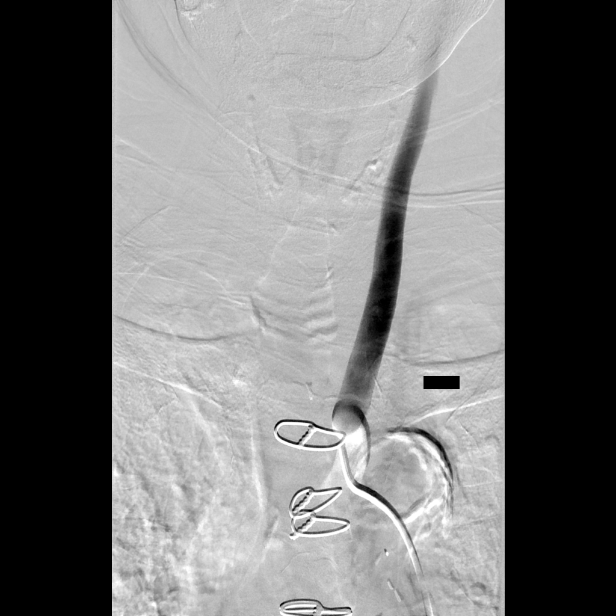
[im 19/186]
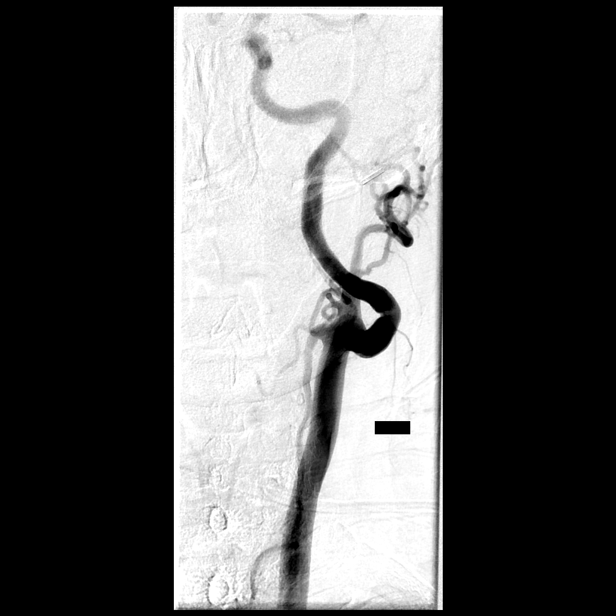
[im 38/186]
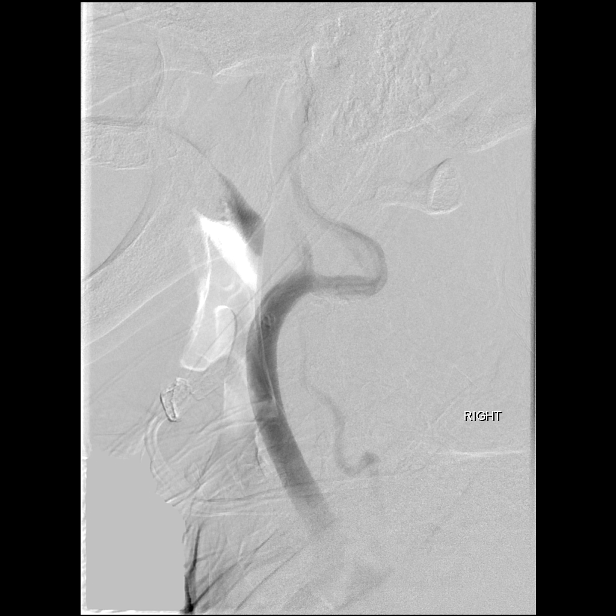
[im 56/186]
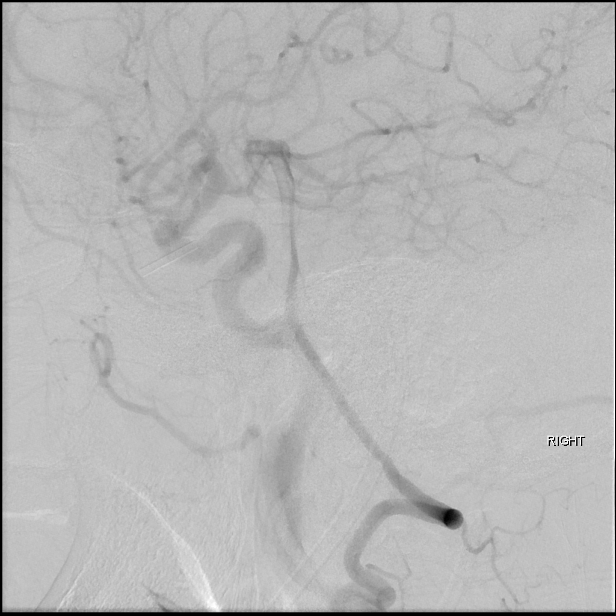
[im 75/186]
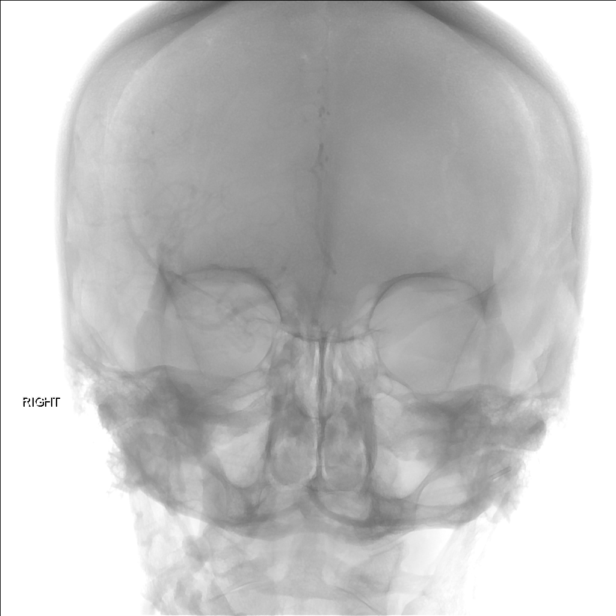
[im 93/186]
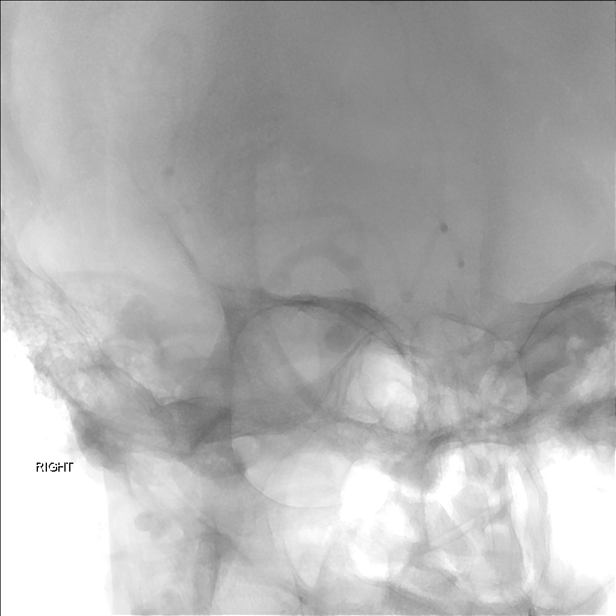
[im 112/186]
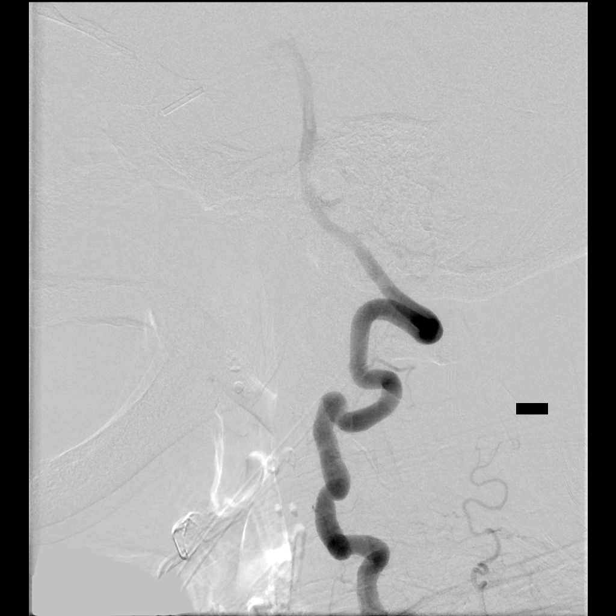
[im 130/186]
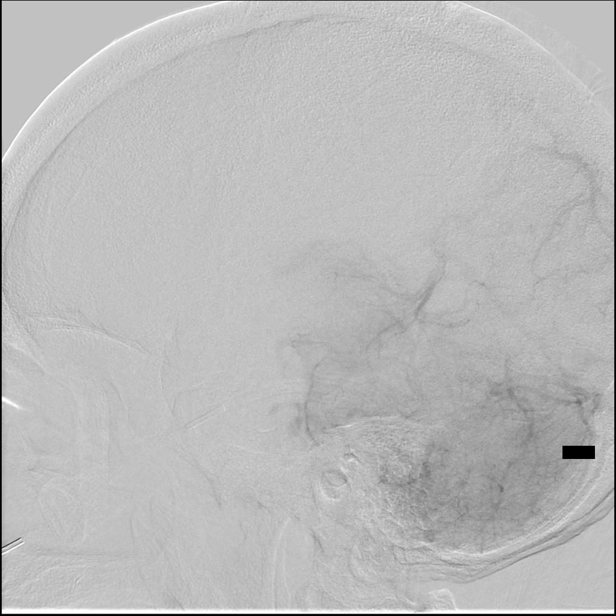
[im 149/186]
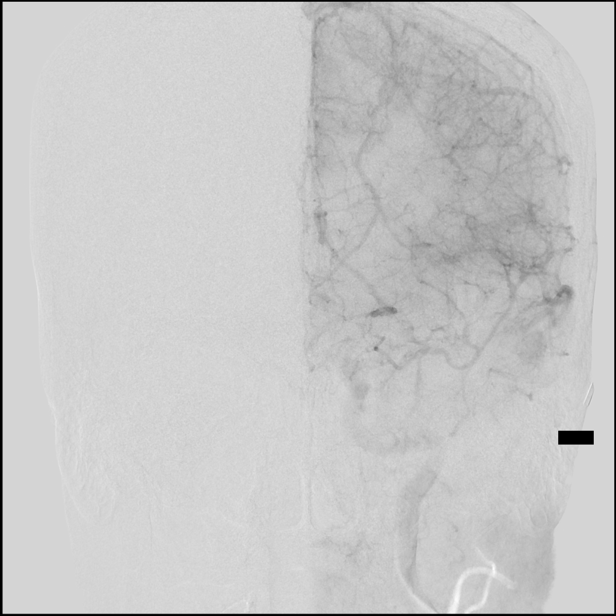
[im 167/186]
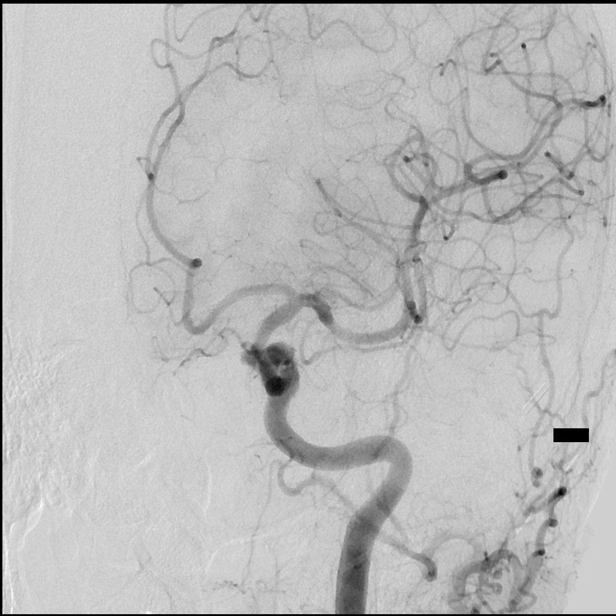
[im 186/186]
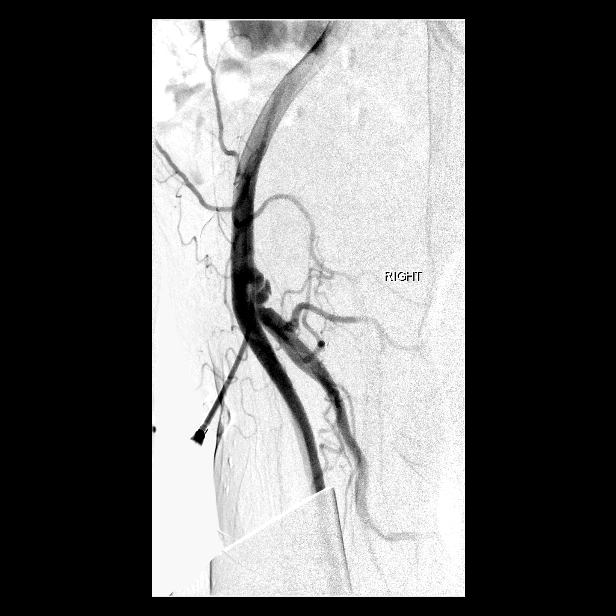

[12 of 24 positions shown; findings below may reference images not displayed]

EXAM:
BILATERAL COMMON CAROTID AND INNOMINATE ANGIOGRAPHY AND BILATERAL
VERTEBRAL ARTERY ANGIOGRAMS

PROCEDURE:
Contrast: 60mL OMNIPAQUE IOHEXOL 300 MG/ML  SOLN

Anesthesia/Sedation:  Conscious sedation.

Medications: Versed 1 mg IV.  Fentanyl 25 mcg IV.

Following a full explanation of the procedure along with the
potential associated complications, an informed witnessed consent
was obtained.

The right groin was prepped and draped in the usual sterile fashion.
Thereafter using modified Seldinger technique, transfemoral access
into the right common femoral artery was obtained without
difficulty. Over a 0.035 inch guidewire, a 5 French Pinnacle sheath
was inserted. Through this, and also over 0.035 inch guidewire, a 5
French JB1 catheter was advanced to the aortic arch region and
selectively positioned in the innominate artery, the left common
carotid artery and the left vertebral artery.

There were no acute complications. The patient tolerated the
procedure well.
FINDINGS: The left common carotid arteriogram demonstrates the left external
carotid artery and its major branches to be normally opacified.

The left internal carotid artery at the bulb to the cranial skull
base opacifies normally.

The petrous and the cavernous segments are widely patent.

Arising in the superior hypophyseal region at the level of the left
ophthalmic artery is a saccular outpouching projecting medially.
This measures approximately 3 mm x 2.8 mm.

Just distal to this there is a severe focal circumferential
narrowing of approximately 70%. The left internal carotid artery
terminus appears widely patent.

The left middle cerebral artery proximally is normal.

Mild narrowing is seen of the distal left M1 segment distal to the
origin of the anterior temporal branch.

The left anterior cerebral artery is seen to opacify normally into
the capillary and venous phases.

The left vertebral artery origin is normal.

The vessel has a tortuous course in the cervical region without
significant narrowing associated with these segmental areas of
tortuosity.

The left vertebrobasilar junction is normally opacified.

The left posterior inferior cerebellar artery demonstrates mild
focal areas of narrowing.

The basilar artery, the superior cerebellar arteries and the
anterior inferior cerebellar arteries are seen to opacify normally
into the capillary and venous phases.

The left posterior cerebral artery P1 segment demonstrates
approximately 65% percent narrowing in the P1 segment.

Distal opacification of the left P2 P3 distribution is seen.

Also demonstrated is non opacification of the right posterior
cerebral artery probably related to inflow from the dominant right
posterior communicating artery.

The right common carotid arteriogram demonstrates the right external
carotid artery and its major branches to be widely patent.

The right internal carotid artery at the bulb demonstrates mild
smooth atherosclerotic plaque without associated stenosis by the
NASCET criteria.

Just distal to the bulb there is a fusiform dilatation of the right
internal carotid artery.

Distal to this the vessel is seen to opacify with another segmental
area of mild fusiform dilatation.

The cervical right ICA at the cranial skull base appears normal in
caliber.

The petrous, the cavernous and the supraclinoid segments demonstrate
wide patency.

A dominant right posterior communicating artery is seen opacifying
the right posterior cerebral artery distribution.

The right middle and the right anterior cerebral arteries opacify
normally into the capillary and venous phases.

The right vertebral artery origin is normal.

This is the nondominant vertebral artery which opacifies to the
cranial skull base to opacify the right posterior-inferior
cerebellar artery and the right vertebrobasilar junction.

Distal opacification into the basilar artery and the superior
cerebellar artery and the left posterior cerebral artery is seen.

Non-opacified blood from the more dominant vertebral artery is seen
in the basilar artery.
IMPRESSION: Approximately 70% narrowing of the left internal carotid artery
supraclinoid segment.

Approximately 3 mm x 2.8 mm saccular outpouching in the left
internal carotid artery superior hypophyseal region at the level of
the ophthalmic artery most consistent with an aneurysm.
Approximately 65 percent narrowing of the left posterior cerebral
artery P1 segment.

Tandem fusiform areas of dilatation involving the proximal right
internal carotid artery, and the mid cervical right ICA.

The angiographic findings were reviewed with patient and the
patient's family.

Given the angiographic findings and the patient's clinical history,
it was decided to continue with conservative management with one
baby aspirin a day. A follow-up MRI MRA of the brain will be
undertaken in 6 months time, or sooner should the patient develop
any symptoms.

Both the patient and the family leave with good understanding and
agreement with the above management plan.

## 2015-09-07 ENCOUNTER — Other Ambulatory Visit: Payer: Self-pay | Admitting: *Deleted

## 2015-09-07 MED ORDER — METOPROLOL SUCCINATE ER 50 MG PO TB24: 50.0000 mg | ORAL_TABLET | Freq: Every day | ORAL | Status: DC

## 2015-09-07 MED ORDER — LISINOPRIL 20 MG PO TABS: 20.0000 mg | ORAL_TABLET | Freq: Every day | ORAL | Status: DC

## 2015-09-26 ENCOUNTER — Other Ambulatory Visit (HOSPITAL_COMMUNITY): Payer: Self-pay | Admitting: Interventional Radiology

## 2015-09-26 DIAGNOSIS — I729 Aneurysm of unspecified site: Secondary | ICD-10-CM

## 2015-10-11 ENCOUNTER — Ambulatory Visit (INDEPENDENT_AMBULATORY_CARE_PROVIDER_SITE_OTHER): Payer: Medicare HMO | Admitting: Nurse Practitioner

## 2015-10-11 ENCOUNTER — Ambulatory Visit (HOSPITAL_COMMUNITY)
Admission: RE | Admit: 2015-10-11 | Discharge: 2015-10-11 | Disposition: A | Discharge status: Home / Self Care | Payer: Medicare HMO | Source: Ambulatory Visit | Attending: Interventional Radiology | Admitting: Interventional Radiology

## 2015-10-11 ENCOUNTER — Encounter: Payer: Self-pay | Admitting: Nurse Practitioner

## 2015-10-11 VITALS — BP 150/68 | HR 68 | Ht 62.0 in | Wt 125.8 lb

## 2015-10-11 DIAGNOSIS — I259 Chronic ischemic heart disease, unspecified: Secondary | ICD-10-CM | POA: Diagnosis not present

## 2015-10-11 DIAGNOSIS — I729 Aneurysm of unspecified site: Secondary | ICD-10-CM

## 2015-10-11 DIAGNOSIS — I10 Essential (primary) hypertension: Secondary | ICD-10-CM | POA: Diagnosis not present

## 2015-10-11 MED ORDER — LISINOPRIL 20 MG PO TABS: 20.0000 mg | ORAL_TABLET | Freq: Every day | ORAL | Status: DC

## 2015-10-11 MED ORDER — METOPROLOL SUCCINATE ER 50 MG PO TB24: 50.0000 mg | ORAL_TABLET | Freq: Every day | ORAL | Status: DC

## 2015-10-11 NOTE — Progress Notes (Signed)
CARDIOLOGY OFFICE NOTE  Date:  10/11/2015    Laura Woodard Date of Birth: 10-10-1923 Medical Record #[id number]  PCP:  Josue Hector, MD  Cardiologist:  [place]    Chief Complaint  Patient presents with  . Hypertension  . Coronary Artery Disease    1 year check. Seen for Dr. [place]    History of Present Illness: Laura Woodard is a 82 y.o. female who presents today for a one year check. Seen for Dr. [place]. Former patient of Dr. Ronnald Nian.   She has known CAD with remote CABG in 2009, HTN and HLD.   Seen back in May of 2014 after going to [facility] for syncope in April of 2014. Records were reviewed and it was felt that she had a vasovagal faint and was probably volume depleted, over heated from sitting under a hair dryer and had a UTI. Did not get a CT of her head, nor carotid dopplers but had a CTA of the chest due to elevated d dimer and that was negative. Potassium was 3.2. HCTZ was cut out of her Lisinopril and she was placed on plain Lisinopril. Also she had stopped her Zoloft and did not wish to resume. She had been given some Ambien as well to help her sleep.   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 Fernande Bras - to be followed. Now on full dose aspirin. At last visit last April, she was felt to be doing well.  Comes back today. Here with her daughter in law today. Son is in the waiting room - he has bronchitis. She continues to do well. No chest pain. Breathing is ok. She is staying active. No falls. Seeing IR later today for her aneurysm of the left ICA. BP looks good. Sees PCP regularly and reports just having had labs. She and her family are happy with how she is doing.  Past Medical History  Diagnosis Date  . Hypertension   . Hyperlipidemia   . Coronary artery disease     s/p CABG in November 2009  . GERD (gastroesophageal reflux disease)   .  Headache(784.0)   . HOH (hard of hearing)   . Arthritis     Past Surgical History  Procedure Laterality Date  . Replacement total knee bilateral    . Cholecystectomy    . Coronary artery bypass graft  November 2009    LIMA to LAD, SVG to DX, SVG to 1st OM and SVG to RCA  . Cardiac catheterization  05/16/2008    EF 70+  . US echocardiography  05/12/2008    EF 55-60%  . Cardiovascular stress test  05/11/2008    EF 61%.ABNORMAL STRESS NUCLEAR STUDY. FINDINGS ARE CONSISTENT WITH ANTERIOR ISCHEMIA  . Abdominal hysterectomy    . Cataract extraction w/phaco Right 12/13/2013    Procedure: CATARACT EXTRACTION PHACO AND INTRAOCULAR LENS PLACEMENT (IOC);  Surgeon: Gemma Payor, MD;  Location: [facility];  Service: Ophthalmology;  Laterality: Right;  CDE 7.89  . Cataract extraction w/phaco Left 01/06/2014    Procedure: CATARACT EXTRACTION PHACO AND INTRAOCULAR LENS PLACEMENT (IOC);  Surgeon: Gemma Payor, MD;  Location: [facility];  Service: Ophthalmology;  Laterality: Left;  CDE:9.23     Medications: Current Outpatient Prescriptions  Medication Sig Dispense Refill  . acetaminophen (TYLENOL) 500 MG tablet Take 500 mg by mouth as needed for mild pain or headache.     Marland Kitchen  amLODipine (NORVASC) 5 MG tablet Take 5 mg by mouth daily.      Marland Kitchen. aspirin EC 325 MG tablet Take 1 tablet (325 mg total) by mouth daily. 30 tablet 0  . atorvastatin (LIPITOR) 20 MG tablet Take 20 mg by mouth at bedtime.     . Cholecalciferol (VITAMIN D) 2000 UNITS tablet Take 2,000 Units by mouth daily.    . Ginkgo Biloba 120 MG CAPS Take 1 capsule by mouth daily.    Marland Kitchen. levothyroxine (SYNTHROID, LEVOTHROID) 50 MCG tablet Take 50 mcg by mouth daily before breakfast.    . lisinopril (PRINIVIL,ZESTRIL) 20 MG tablet Take 1 tablet (20 mg total) by mouth daily. 90 tablet 3  . metoprolol succinate (TOPROL-XL) 50 MG 24 hr tablet Take 1 tablet (50 mg total) by mouth daily. Take with or immediately following a meal. 90 tablet 3  . mirtazapine (REMERON) 15  MG tablet Take 15 mg by mouth at bedtime.    . Multiple Vitamin (MULTIVITAMIN) tablet Take 1 tablet by mouth daily.      . Omega-3 Fatty Acids (FISH OIL) 1200 MG CAPS Take 1 capsule by mouth daily.      . pantoprazole (PROTONIX) 40 MG tablet Take 40 mg by mouth daily.    . Simethicone (GAS-X PO) Take 1 tablet by mouth daily as needed. Indigestion     . vitamin B-12 (CYANOCOBALAMIN) 1000 MCG tablet Take 1,000 mcg by mouth daily.     . vitamin E 100 UNIT capsule Take 100 Units by mouth daily.    Marland Kitchen. zolpidem (AMBIEN) 5 MG tablet Take 5 mg by mouth every other day.     No current facility-administered medications for this visit.    Allergies: No Known Allergies  Social History: The patient  reports that she has never smoked. She does not have any smokeless tobacco history on file. She reports that she does not drink alcohol or use illicit drugs.   Family History: The patient's family history includes Coronary artery disease in her father; Lung cancer in her mother.   Review of Systems: Please see the history of present illness.   Otherwise, the review of systems is positive for none.   All other systems are reviewed and negative.   Physical Exam: VS:  BP 150/68 mmHg  Pulse 68  Ht 5\' 2"  (1.575 m)  Wt 125 lb 12.8 oz (57.063 kg)  BMI 23.00 kg/m2 .  BMI Body mass index is 23 kg/(m^2).  Wt Readings from Last 3 Encounters:  10/11/15 125 lb 12.8 oz (57.063 kg)  02/21/15 124 lb (56.246 kg)  10/12/14 125 lb (56.7 kg)    General: Pleasant. Elderly female who is alert. She is in no acute distress. Looks younger than her stated age. Weight is stable.  HEENT: Normal. Neck: Supple, no JVD, carotid bruits, or masses noted.  Cardiac: Regular rate and rhythm. Very soft outflow murmur. No edema.  Respiratory:  Lungs are clear to auscultation bilaterally with normal work of breathing.  GI: Soft and nontender.  MS: No deformity or atrophy. Gait and ROM intact. Skin: Warm and dry. Color is normal.    Neuro:  Strength and sensation are intact and no gross focal deficits noted.  Psych: Alert, appropriate and with normal affect.   LABORATORY DATA:  EKG:  EKG is not ordered today.  Lab Results  Component Value Date   WBC 5.2 02/21/2015   HGB 10.6* 02/21/2015   HCT 32.6* 02/21/2015   PLT 204 02/21/2015  GLUCOSE 99 02/21/2015   CHOL 120 06/14/2014   TRIG 143 06/14/2014   HDL 46 06/14/2014   LDLCALC 45 06/14/2014   ALT 12 06/13/2014   AST 23 06/13/2014   NA 135 02/21/2015   K 4.1 02/21/2015   CL 103 02/21/2015   CREATININE 1.06* 02/21/2015   BUN 23* 02/21/2015   CO2 22 02/21/2015   INR 1.07 02/21/2015   HGBA1C 6.0* 06/13/2014    BNP (last 3 results) No results for input(s): BNP in the last 8760 hours.  ProBNP (last 3 results) No results for input(s): PROBNP in the last 8760 hours.   Other Studies Reviewed Today:  Echo Study Conclusions from 06/2014  - Left ventricle: The cavity size was normal. Systolic function was normal. The estimated ejection fraction was in the range of 60% to 65%. Wall motion was normal; there were no regional wall motion abnormalities. Doppler parameters are consistent with abnormal left ventricular relaxation (grade 1 diastolic dysfunction). Doppler parameters are consistent with high ventricular filling pressure. Mild concentric and moderate focal basal septal hypertrophy. - Aortic valve: Mildly calcified annulus. Trileaflet; mildly thickened, mildly calcified leaflets. There was mild regurgitation. - Aorta: Mild ascending aortic dilatation. Maximum diameter 3.67 cm. - Mitral valve: There was mild to moderate regurgitation. - Left atrium: The atrium was mildly to moderately dilated. - Right atrium: The atrium was mildly to moderately dilated. - Tricuspid valve: There was mild-moderate regurgitation. - Pulmonary arteries: PA peak pressure: 35 mm Hg (S). Mildly elevated pulmonary  pressures.  Assessment/Plan: 1. CAD - prior CABG in 2009 - no symptoms reported. Conservative management.  She continues to do well. Her metoprolol and Lisinopril are refilled today.  2. Prior stroke noted in December 2015 - now on full dose aspirin.  2. Incidental aneurysm of the left carotid - to be followed. Seeing IR later today.   3. HTN - BP looks good on her current regimen.    4. Prior syncope - without recurrence.   Current medicines are reviewed with the patient today.  The patient does not have concerns regarding medicines other than what has been noted above.  The following changes have been made:  See above.  Labs/ tests ordered today include:   No orders of the defined types were placed in this encounter.     Disposition:   FU with me in 1 year.   Patient is agreeable to this plan and will call if any problems develop in the interim.   Signed: Rosalio Macadamia, RN, ANP-C 10/11/2015 11:22 AM  [facility] [address] [place], [place]  [address] Phone: [phone] Fax: [phone]

## 2015-10-11 NOTE — Patient Instructions (Addendum)
We will be checking the following labs today - NONE   Medication Instructions:    Continue with your current medicines.     Testing/Procedures To Be Arranged:  N/A  Follow-Up:   See me in one year.     Other Special Instructions:   N/A    If you need a refill on your cardiac medications before your next appointment, please call your pharmacy.   Call the [facility] office at [phone] if you have any questions, problems or concerns.      

## 2015-10-31 ENCOUNTER — Telehealth (HOSPITAL_COMMUNITY): Payer: Self-pay

## 2015-10-31 NOTE — Telephone Encounter (Signed)
Called to schedule CT scan with Dr. Corliss Skainseveshwar, left message for pt to call back and schedule. AW

## 2015-11-07 ENCOUNTER — Other Ambulatory Visit (HOSPITAL_COMMUNITY): Payer: Self-pay | Admitting: Interventional Radiology

## 2015-11-07 DIAGNOSIS — I729 Aneurysm of unspecified site: Secondary | ICD-10-CM

## 2015-11-20 ENCOUNTER — Ambulatory Visit (HOSPITAL_COMMUNITY)
Admission: RE | Admit: 2015-11-20 | Discharge: 2015-11-20 | Disposition: A | Discharge status: Home / Self Care | Payer: Medicare HMO | Source: Ambulatory Visit | Attending: Interventional Radiology | Admitting: Interventional Radiology

## 2015-11-20 ENCOUNTER — Ambulatory Visit (HOSPITAL_COMMUNITY): Payer: Medicare HMO

## 2015-11-20 DIAGNOSIS — I6523 Occlusion and stenosis of bilateral carotid arteries: Secondary | ICD-10-CM | POA: Insufficient documentation

## 2015-11-20 DIAGNOSIS — I729 Aneurysm of unspecified site: Secondary | ICD-10-CM | POA: Diagnosis present

## 2015-11-20 DIAGNOSIS — R918 Other nonspecific abnormal finding of lung field: Secondary | ICD-10-CM | POA: Diagnosis not present

## 2015-11-20 LAB — POCT I-STAT CREATININE: Creatinine, Ser: 0.9 mg/dL (ref 0.44–1.00)

## 2015-11-20 MED ORDER — IOPAMIDOL (ISOVUE-370) INJECTION 76%
INTRAVENOUS | Status: AC
  Administered 2015-11-20: 50 mL
  Filled 2015-11-20: qty 50

## 2015-11-23 ENCOUNTER — Other Ambulatory Visit (HOSPITAL_COMMUNITY): Payer: Self-pay | Admitting: Interventional Radiology

## 2015-11-23 DIAGNOSIS — I729 Aneurysm of unspecified site: Secondary | ICD-10-CM

## 2015-12-14 ENCOUNTER — Ambulatory Visit (HOSPITAL_COMMUNITY)
Admission: RE | Admit: 2015-12-14 | Discharge: 2015-12-14 | Disposition: A | Discharge status: Home / Self Care | Payer: Medicare HMO | Source: Ambulatory Visit | Attending: Interventional Radiology | Admitting: Interventional Radiology

## 2015-12-14 DIAGNOSIS — I729 Aneurysm of unspecified site: Secondary | ICD-10-CM

## 2016-07-19 ENCOUNTER — Emergency Department (HOSPITAL_COMMUNITY): Payer: Medicare HMO

## 2016-07-19 ENCOUNTER — Emergency Department (HOSPITAL_COMMUNITY)
Admission: EM | Admit: 2016-07-19 | Discharge: 2016-07-19 | Disposition: A | Discharge status: Home / Self Care | Payer: Medicare HMO | Attending: Emergency Medicine | Admitting: Emergency Medicine

## 2016-07-19 ENCOUNTER — Encounter (HOSPITAL_COMMUNITY): Payer: Self-pay | Admitting: Emergency Medicine

## 2016-07-19 DIAGNOSIS — I208 Other forms of angina pectoris: Secondary | ICD-10-CM | POA: Diagnosis not present

## 2016-07-19 DIAGNOSIS — R05 Cough: Secondary | ICD-10-CM | POA: Diagnosis not present

## 2016-07-19 DIAGNOSIS — Z951 Presence of aortocoronary bypass graft: Secondary | ICD-10-CM | POA: Diagnosis not present

## 2016-07-19 DIAGNOSIS — I2510 Atherosclerotic heart disease of native coronary artery without angina pectoris: Secondary | ICD-10-CM | POA: Diagnosis not present

## 2016-07-19 DIAGNOSIS — Z79899 Other long term (current) drug therapy: Secondary | ICD-10-CM | POA: Insufficient documentation

## 2016-07-19 DIAGNOSIS — I10 Essential (primary) hypertension: Secondary | ICD-10-CM | POA: Insufficient documentation

## 2016-07-19 DIAGNOSIS — Z7982 Long term (current) use of aspirin: Secondary | ICD-10-CM | POA: Diagnosis not present

## 2016-07-19 DIAGNOSIS — R072 Precordial pain: Secondary | ICD-10-CM | POA: Diagnosis present

## 2016-07-19 LAB — BASIC METABOLIC PANEL
Anion gap: 8 (ref 5–15)
BUN: 21 mg/dL — AB (ref 6–20)
CHLORIDE: 100 mmol/L — AB (ref 101–111)
CO2: 26 mmol/L (ref 22–32)
CREATININE: 0.83 mg/dL (ref 0.44–1.00)
Calcium: 9.6 mg/dL (ref 8.9–10.3)
GFR calc non Af Amer: >60 mL/min (ref >60)
Glucose, Bld: 109 mg/dL — ABNORMAL HIGH (ref 65–99)
Potassium: 4.4 mmol/L (ref 3.5–5.1)
Sodium: 134 mmol/L — ABNORMAL LOW (ref 135–145)

## 2016-07-19 LAB — CBC WITH DIFFERENTIAL/PLATELET
BASOS PCT: 0 %
Basophils Absolute: 0 10*3/uL (ref 0.0–0.1)
EOS ABS: 0.3 10*3/uL (ref 0.0–0.7)
EOS PCT: 4 %
HCT: 34.1 % — ABNORMAL LOW (ref 36.0–46.0)
Hemoglobin: 11.1 g/dL — ABNORMAL LOW (ref 12.0–15.0)
Lymphocytes Relative: 17 %
Lymphs Abs: 1.2 10*3/uL (ref 0.7–4.0)
MCH: 30.7 pg (ref 26.0–34.0)
MCHC: 32.6 g/dL (ref 30.0–36.0)
MCV: 94.2 fL (ref 78.0–100.0)
MONO ABS: 0.4 10*3/uL (ref 0.1–1.0)
MONOS PCT: 6 %
NEUTROS PCT: 73 %
Neutro Abs: 5 10*3/uL (ref 1.7–7.7)
PLATELETS: 228 10*3/uL (ref 150–400)
RBC: 3.62 MIL/uL — ABNORMAL LOW (ref 3.87–5.11)
RDW: 12.8 % (ref 11.5–15.5)
WBC: 6.8 10*3/uL (ref 4.0–10.5)

## 2016-07-19 LAB — HEPATIC FUNCTION PANEL
ALBUMIN: 3.9 g/dL (ref 3.5–5.0)
ALT: 15 U/L (ref 14–54)
AST: 27 U/L (ref 15–41)
Alkaline Phosphatase: 85 U/L (ref 38–126)
BILIRUBIN INDIRECT: 0.5 mg/dL (ref 0.3–0.9)
Bilirubin, Direct: 0.1 mg/dL (ref 0.1–0.5)
TOTAL PROTEIN: 7.7 g/dL (ref 6.5–8.1)
Total Bilirubin: 0.6 mg/dL (ref 0.3–1.2)

## 2016-07-19 LAB — TROPONIN I: Troponin I: <0.03 ng/mL (ref <0.03)

## 2016-07-19 MED ORDER — NITROGLYCERIN 0.4 MG SL SUBL: 0.4000 mg | SUBLINGUAL_TABLET | SUBLINGUAL | 0 refills | Status: AC | PRN

## 2016-07-19 NOTE — Discharge Instructions (Signed)
Increase your Norvasc to 10mg  a day  or two 5mg  tablets a day.   Follow up with Dr. Darl HouseholderKoneswaren.  The office will call with an appointment

## 2016-07-19 NOTE — ED Provider Notes (Signed)
[facility] Provider Note   CSN: [id number] Arrival date & time: 07/19/16  0909  By signing my name below, I, Vista Minkobert Ross, attest that this documentation has been prepared under the direction and in the presence of Bethann BerkshireJoseph Ladawna Walgren, MD. Electronically signed, Vista Minkobert Ross, ED Scribe. 07/19/16. 9:35 AM.   History   Chief Complaint Chief Complaint  Patient presents with  . Chest Pain    HPI HPI Comments: The history is provided by the patient. No language interpreter was used.  Chest Pain   This is a new problem. The current episode started 3 to 5 hours ago (0630). The problem occurs hourly. The problem has been resolved. The pain is present in the substernal region. The pain does not radiate. Associated symptoms include cough. Pertinent negatives include no abdominal pain, no back pain and no headaches. She has tried nothing for the symptoms. Risk factors include being elderly.  Her past medical history is significant for CAD and hypertension.  Pertinent negatives for past medical history include no seizures.   Past Medical History:  Diagnosis Date  . Arthritis   . Coronary artery disease    s/p CABG in November 2009  . GERD (gastroesophageal reflux disease)   . Headache(784.0)   . HOH (hard of hearing)   . Hyperlipidemia   . Hypertension     Patient Active Problem List   Diagnosis Date Noted  . Brain aneurysm   . CVA (cerebral infarction) 06/13/2014  . HTN (hypertension) 06/13/2014  . CAD (coronary artery disease) 04/22/2011  . Syncope 04/22/2011  . Hyperlipidemia 04/22/2011  . GERD (gastroesophageal reflux disease) 04/22/2011    Past Surgical History:  Procedure Laterality Date  . ABDOMINAL HYSTERECTOMY    . CARDIAC CATHETERIZATION  05/16/2008   EF 70+  . CARDIOVASCULAR STRESS TEST  05/11/2008   EF 61%.ABNORMAL STRESS NUCLEAR STUDY. FINDINGS ARE CONSISTENT WITH ANTERIOR ISCHEMIA  . CATARACT EXTRACTION W/PHACO Right 12/13/2013   Procedure: CATARACT EXTRACTION  PHACO AND INTRAOCULAR LENS PLACEMENT (IOC);  Surgeon: Gemma PayorKerry Hunt, MD;  Location: [facility];  Service: Ophthalmology;  Laterality: Right;  CDE 7.89  . CATARACT EXTRACTION W/PHACO Left 01/06/2014   Procedure: CATARACT EXTRACTION PHACO AND INTRAOCULAR LENS PLACEMENT (IOC);  Surgeon: Gemma PayorKerry Hunt, MD;  Location: [facility];  Service: Ophthalmology;  Laterality: Left;  CDE:9.23  . CHOLECYSTECTOMY    . CORONARY ARTERY BYPASS GRAFT  November 2009   LIMA to LAD, SVG to DX, SVG to 1st OM and SVG to RCA  . REPLACEMENT TOTAL KNEE BILATERAL    . US ECHOCARDIOGRAPHY  05/12/2008   EF 55-60%    OB History    No data available       Home Medications    Prior to Admission medications   Medication Sig Start Date End Date Taking? Authorizing Provider  acetaminophen (TYLENOL) 500 MG tablet Take 500 mg by mouth as needed for mild pain or headache.     Historical Provider, MD  amLODipine (NORVASC) 5 MG tablet Take 5 mg by mouth daily.      Historical Provider, MD  aspirin EC 325 MG tablet Take 1 tablet (325 mg total) by mouth daily. 06/14/14   Calvert CantorSaima Rizwan, MD  atorvastatin (LIPITOR) 20 MG tablet Take 20 mg by mouth at bedtime.     Historical Provider, MD  Cholecalciferol (VITAMIN D) 2000 UNITS tablet Take 2,000 Units by mouth daily.    Historical Provider, MD  Ginkgo Biloba 120 MG CAPS Take 1 capsule by mouth daily.  Historical Provider, MD  levothyroxine (SYNTHROID, LEVOTHROID) 50 MCG tablet Take 50 mcg by mouth daily before breakfast.    Historical Provider, MD  lisinopril (PRINIVIL,ZESTRIL) 20 MG tablet Take 1 tablet (20 mg total) by mouth daily. 10/11/15   Rosalio Macadamia, NP  metoprolol succinate (TOPROL-XL) 50 MG 24 hr tablet Take 1 tablet (50 mg total) by mouth daily. Take with or immediately following a meal. 10/11/15   Rosalio Macadamia, NP  mirtazapine (REMERON) 15 MG tablet Take 15 mg by mouth at bedtime. 03/01/14   Historical Provider, MD  Multiple Vitamin (MULTIVITAMIN) tablet Take 1 tablet by mouth daily.       Historical Provider, MD  Omega-3 Fatty Acids (FISH OIL) 1200 MG CAPS Take 1 capsule by mouth daily.      Historical Provider, MD  pantoprazole (PROTONIX) 40 MG tablet Take 40 mg by mouth daily.    Historical Provider, MD  Simethicone (GAS-X PO) Take 1 tablet by mouth daily as needed. Indigestion     Historical Provider, MD  vitamin B-12 (CYANOCOBALAMIN) 1000 MCG tablet Take 1,000 mcg by mouth daily.     Historical Provider, MD  vitamin E 100 UNIT capsule Take 100 Units by mouth daily.    Historical Provider, MD  zolpidem (AMBIEN) 5 MG tablet Take 5 mg by mouth every other day.    Historical Provider, MD    Family History Family History  Problem Relation Age of Onset  . Lung cancer Mother   . Coronary artery disease Father     Social History Social History  Substance Use Topics  . Smoking status: Never Smoker  . Smokeless tobacco: Never Used  . Alcohol use No     Allergies   Sulfa antibiotics   Review of Systems Review of Systems  Constitutional: Negative for appetite change and fatigue.  HENT: Negative for congestion, ear discharge and sinus pressure.   Eyes: Negative for discharge.  Respiratory: Positive for cough.   Cardiovascular: Positive for chest pain.  Gastrointestinal: Negative for abdominal pain and diarrhea.  Genitourinary: Negative for frequency and hematuria.  Musculoskeletal: Negative for back pain.  Skin: Negative for rash.  Neurological: Negative for seizures and headaches.  Psychiatric/Behavioral: Negative for hallucinations.     Physical Exam Updated Vital Signs BP 185/79 (BP Location: Left Arm)   Pulse 73   Temp 98.1 F (36.7 C) (Oral)   Ht 5\' 3"  (1.6 m)   Wt 120 lb (54.4 kg)   SpO2 97%   BMI 21.26 kg/m   Physical Exam  Constitutional: She is oriented to person, place, and time. She appears well-developed.  HENT:  Head: Normocephalic.  Eyes: Conjunctivae and EOM are normal. No scleral icterus.  Neck: Neck supple. No thyromegaly present.    Cardiovascular: Normal rate and regular rhythm.  Exam reveals no gallop and no friction rub.   No murmur heard. Pulmonary/Chest: No stridor. She has no wheezes. She has no rales. She exhibits no tenderness.  Abdominal: She exhibits no distension. There is no tenderness. There is no rebound.  Musculoskeletal: Normal range of motion. She exhibits no edema.  Lymphadenopathy:    She has no cervical adenopathy.  Neurological: She is oriented to person, place, and time. She exhibits normal muscle tone. Coordination normal.  Skin: No rash noted. No erythema.  Psychiatric: She has a normal mood and affect. Her behavior is normal.     ED Treatments / Results  DIAGNOSTIC STUDIES: Oxygen Saturation is 97% on RA, normal by my interpretation.  COORDINATION OF CARE: 9:32 AM-Discussed treatment plan with pt at bedside and pt agreed to plan.   Labs (all labs ordered are listed, but only abnormal results are displayed) Labs Reviewed  BASIC METABOLIC PANEL  CBC  TROPONIN I    EKG  EKG Interpretation None       Radiology No results found.  Procedures Procedures (including critical care time)  Medications Ordered in ED Medications - No data to display   Initial Impression / Assessment and Plan / ED Course  I have reviewed the triage vital signs and the nursing notes.  Pertinent labs & imaging results that were available during my care of the patient were reviewed by me and considered in my medical decision making (see chart for details).  Clinical Course     Patient states she had a hurting in her chest for an hour and a half but since she got in the car to come to the emergency department she is not hurt at all. Labs unremarkable including 2 troponins that are negative. I spoke with cardiology and the patient will have her Norvasc increased to 10 mg a day and also given a prescription of nitroglycerin. She will follow-up with cardiology in the next week  Final Clinical  Impressions(s) / ED Diagnoses   Final diagnoses:  None    New Prescriptions New Prescriptions   No medications on file  The chart was scribed for me under my direct supervision.  I personally performed the history, physical, and medical decision making and all procedures in the evaluation of this patient.Bethann Berkshire, MD 07/19/16 [phone]

## 2016-07-19 NOTE — ED Triage Notes (Signed)
Patient states left sided chest pain that started upon waking this morning. States chest pain resolved while on way to ED. Denies shortness of breath. Hx of bypass surgery.

## 2016-07-31 ENCOUNTER — Telehealth (HOSPITAL_COMMUNITY): Payer: Self-pay

## 2016-07-31 NOTE — Telephone Encounter (Signed)
Called to schedule f/u ct scan, left message for pt to return call. AW 

## 2016-08-01 ENCOUNTER — Telehealth (HOSPITAL_COMMUNITY): Payer: Self-pay

## 2016-08-01 NOTE — Telephone Encounter (Signed)
Pt stated that she does not want to schedule f/u ct scan. She feels that there is nothing that can be done so there is no need for follow up exams. AW

## 2016-08-07 ENCOUNTER — Encounter: Payer: Medicare HMO | Admitting: Physician Assistant

## 2016-10-01 ENCOUNTER — Other Ambulatory Visit: Payer: Self-pay | Admitting: Nurse Practitioner

## 2016-10-03 ENCOUNTER — Encounter: Payer: Self-pay | Admitting: Nurse Practitioner

## 2016-10-15 NOTE — Progress Notes (Signed)
CARDIOLOGY OFFICE NOTE  Date:  10/16/2016    [name] Date of Birth: 04-25-1924 Medical Record #[id number]  PCP:  Josue Hector, MD  Cardiologist:  Navya Timmons & [place]   Chief Complaint  Patient presents with  . Coronary Artery Disease    Follow up visit - seen for Dr. [place]    History of Present Illness: Laura Woodard is a 83 y.o. female who presents today for a follow up visit. Seen for Dr. [place]. Former patient of Dr. Ronnald Nian.   She has known CAD with remote CABG in 2009, HTN and HLD.   Seen back in May of 2014 after going to [facility] for syncope in April of 2014. Records were reviewed and it was felt that she had a vasovagal faint and was probably volume depleted, over heated from sitting under a hair dryer and had a UTI. Did not get a CT of her head, nor carotid dopplers but had a CTA of the chest due to elevated d dimer and that was negative. Potassium was 3.2. HCTZ was cut out of her Lisinopril and she was placed on plain Lisinopril. Also she had stopped her Zoloft and did not wish to resume. She had been given some Ambien as well to help her sleep.   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 Fernande Bras - to be followed - but looks like she did not get her last CT scan. Also with some pulmonary nodules noted. Now on full dose aspirin. At last visit last April of 2017, she was felt to be doing well.  In the ER back in January with chest pain - called cardiology who advised increasing the Norvasc and follow up back here - this was not done.   Comes back today. Here with her son. She is doing well. Now 38 years old. Her other son passed last year with an MI in his 32's. She has not had any more chest pain. She is on the 10 mg of Norvasc. She has decided to not have any more scans on her neck. She is not planning on going back.  She does have some  fatigue. She is still doing her puzzles. Her mind remains very sharp. She is not very active but overall seems to be holding her own.   Past Medical History:  Diagnosis Date  . Arthritis   . Coronary artery disease    s/p CABG in November 2009  . GERD (gastroesophageal reflux disease)   . Headache(784.0)   . HOH (hard of hearing)   . Hyperlipidemia   . Hypertension     Past Surgical History:  Procedure Laterality Date  . ABDOMINAL HYSTERECTOMY    . CARDIAC CATHETERIZATION  05/16/2008   EF 70+  . CARDIOVASCULAR STRESS TEST  05/11/2008   EF 61%.ABNORMAL STRESS NUCLEAR STUDY. FINDINGS ARE CONSISTENT WITH ANTERIOR ISCHEMIA  . CATARACT EXTRACTION W/PHACO Right 12/13/2013   Procedure: CATARACT EXTRACTION PHACO AND INTRAOCULAR LENS PLACEMENT (IOC);  Surgeon: Gemma Payor, MD;  Location: [facility];  Service: Ophthalmology;  Laterality: Right;  CDE 7.89  . CATARACT EXTRACTION W/PHACO Left 01/06/2014   Procedure: CATARACT EXTRACTION PHACO AND INTRAOCULAR LENS PLACEMENT (IOC);  Surgeon: Gemma Payor, MD;  Location: [facility];  Service: Ophthalmology;  Laterality: Left;  CDE:9.23  . CHOLECYSTECTOMY    . CORONARY ARTERY BYPASS GRAFT  November 2009   LIMA to LAD,  SVG to DX, SVG to 1st OM and SVG to RCA  . REPLACEMENT TOTAL KNEE BILATERAL    . US ECHOCARDIOGRAPHY  05/12/2008   EF 55-60%     Medications: Current Outpatient Prescriptions  Medication Sig Dispense Refill  . acetaminophen (TYLENOL) 500 MG tablet Take 500 mg by mouth as needed for mild pain or headache.     Marland Kitchen amLODipine (NORVASC) 10 MG tablet Take 10 mg by mouth daily.     Marland Kitchen aspirin EC 325 MG tablet Take 1 tablet (325 mg total) by mouth daily. 30 tablet 0  . atorvastatin (LIPITOR) 20 MG tablet Take 20 mg by mouth at bedtime.     . Cholecalciferol (VITAMIN D) 2000 UNITS tablet Take 2,000 Units by mouth daily.    . Ginkgo Biloba 120 MG CAPS Take 1 capsule by mouth daily.    Marland Kitchen levothyroxine (SYNTHROID, LEVOTHROID) 50 MCG tablet Take 50 mcg by  mouth daily before breakfast.    . lisinopril (PRINIVIL,ZESTRIL) 20 MG tablet Take 1 tablet (20 mg total) by mouth daily. 90 tablet 3  . metoprolol succinate (TOPROL-XL) 50 MG 24 hr tablet TAKE ONE TABLET BY MOUTH ONCE DAILY WITH  OR  IMMEDIATELY  FOLLOWING  A  MEAL 90 tablet 3  . mirtazapine (REMERON) 15 MG tablet Take 15 mg by mouth at bedtime.    . Multiple Vitamin (MULTIVITAMIN) tablet Take 1 tablet by mouth daily.      . nitroGLYCERIN (NITROSTAT) 0.4 MG SL tablet Place 1 tablet (0.4 mg total) under the tongue every 5 (five) minutes as needed for chest pain. 30 tablet 0  . Omega-3 Fatty Acids (FISH OIL) 1200 MG CAPS Take 1 capsule by mouth daily.      . pantoprazole (PROTONIX) 40 MG tablet Take 40 mg by mouth daily.    . Simethicone (GAS-X PO) Take 1 tablet by mouth daily as needed. Indigestion     . vitamin B-12 (CYANOCOBALAMIN) 1000 MCG tablet Take 1,000 mcg by mouth daily.     . vitamin E 100 UNIT capsule Take 100 Units by mouth daily.    Marland Kitchen zolpidem (AMBIEN) 5 MG tablet Take 5 mg by mouth every other day.     No current facility-administered medications for this visit.     Allergies: Allergies  Allergen Reactions  . Sulfa Antibiotics Hives and Itching  . Sulfamethoxazole-Trimethoprim Nausea Only    Social History: The patient  reports that she has never smoked. She has never used smokeless tobacco. She reports that she does not drink alcohol or use drugs.   Family History: The patient's family history includes Coronary artery disease in her father; Lung cancer in her mother.   Review of Systems: Please see the history of present illness.   Otherwise, the review of systems is positive for none.   All other systems are reviewed and negative.   Physical Exam: VS:  BP 140/68   Pulse 76   Ht  (1.575 m)   Wt 122 lb 12.8 oz (55.7 kg)   SpO2 100%   BMI 22.46 kg/m  .  BMI Body mass index is 22.46 kg/m.  Wt Readings from Last 3 Encounters:  10/16/16 122 lb 12.8 oz (55.7  kg)  07/19/16 120 lb (54.4 kg)  10/11/15 125 lb 12.8 oz (57.1 kg)    General: Pleasant. Elderly female. Chronically thin. She is alert and in no acute distress.  She does look pale to me.  HEENT: Normal.  Neck: Supple, no JVD,  carotid bruits, or masses noted.  Cardiac: Regular rate and rhythm. No murmurs, rubs, or gallops. No edema.  Respiratory:  Lungs are clear to auscultation bilaterally with normal work of breathing.  GI: Soft and nontender.  MS: No deformity or atrophy. Gait and ROM intact.  Skin: Warm and dry. Color is normal.  Neuro:  Strength and sensation are intact and no gross focal deficits noted.  Psych: Alert, appropriate and with normal affect.   LABORATORY DATA:  EKG:  EKG is not ordered today.   Lab Results  Component Value Date   WBC 6.8 07/19/2016   HGB 11.1 (L) 07/19/2016   HCT 34.1 (L) 07/19/2016   PLT 228 07/19/2016   GLUCOSE 109 (H) 07/19/2016   CHOL 120 06/14/2014   TRIG 143 06/14/2014   HDL 46 06/14/2014   LDLCALC 45 06/14/2014   ALT 15 07/19/2016   AST 27 07/19/2016   NA 134 (L) 07/19/2016   K 4.4 07/19/2016   CL 100 (L) 07/19/2016   CREATININE 0.83 07/19/2016   BUN 21 (H) 07/19/2016   CO2 26 07/19/2016   INR 1.07 02/21/2015   HGBA1C 6.0 (H) 06/13/2014    BNP (last 3 results) No results for input(s): BNP in the last 8760 hours.  ProBNP (last 3 results) No results for input(s): PROBNP in the last 8760 hours.   Other Studies Reviewed Today:  Echo Study Conclusions from 06/2014  - Left ventricle: The cavity size was normal. Systolic function was normal. The estimated ejection fraction was in the range of 60% to 65%. Wall motion was normal; there were no regional wall motion abnormalities. Doppler parameters are consistent with abnormal left ventricular relaxation (grade 1 diastolic dysfunction). Doppler parameters are consistent with high ventricular filling pressure. Mild concentric and moderate focal basal septal  hypertrophy. - Aortic valve: Mildly calcified annulus. Trileaflet; mildly thickened, mildly calcified leaflets. There was mild regurgitation. - Aorta: Mild ascending aortic dilatation. Maximum diameter 3.67 cm. - Mitral valve: There was mild to moderate regurgitation. - Left atrium: The atrium was mildly to moderately dilated. - Right atrium: The atrium was mildly to moderately dilated. - Tricuspid valve: There was mild-moderate regurgitation. - Pulmonary arteries: PA peak pressure: 35 mm Hg (S). Mildly elevated pulmonary pressures.  Assessment/Plan:  1. CAD - prior CABG in 2009 - no more symptoms reported. Conservative management.    2. Prior stroke noted in December 2015 - now on full dose aspirin.  3. Incidental aneurysm of the carotid - to be followed. She has decided to not have any more follow up.   4. HTN - BP ok on her current regimen.    5. Prior syncope - without recurrence.   6. Advanced age. Seems to be holding her own. She does look a little pale - little anemic on last labs - will update today.   Current medicines are reviewed with the patient today.  The patient does not have concerns regarding medicines other than what has been noted above.  The following changes have been made:  See above.  Labs/ tests ordered today include:    Orders Placed This Encounter  Procedures  . Basic metabolic panel  . CBC  . TSH     Disposition:   FU with me in 1 year.   Patient is agreeable to this plan and will call if any problems develop in the interim.   SignedNorma Fredrickson, NP  10/16/2016 11:18 AM  [facility] [address]  [place], [place]  [address] Phone: [phone] Fax: [phone]

## 2016-10-16 ENCOUNTER — Encounter: Payer: Self-pay | Admitting: Nurse Practitioner

## 2016-10-16 ENCOUNTER — Ambulatory Visit (INDEPENDENT_AMBULATORY_CARE_PROVIDER_SITE_OTHER): Payer: Medicare HMO | Admitting: Nurse Practitioner

## 2016-10-16 VITALS — BP 140/68 | HR 76 | Ht 62.0 in | Wt 122.8 lb

## 2016-10-16 DIAGNOSIS — E7800 Pure hypercholesterolemia, unspecified: Secondary | ICD-10-CM

## 2016-10-16 DIAGNOSIS — I259 Chronic ischemic heart disease, unspecified: Secondary | ICD-10-CM

## 2016-10-16 DIAGNOSIS — I10 Essential (primary) hypertension: Secondary | ICD-10-CM

## 2016-10-16 NOTE — Patient Instructions (Addendum)
We will be checking the following labs today - BMET, CBC and TSH   Medication Instructions:    Continue with your current medicines.     Testing/Procedures To Be Arranged:  N/A  Follow-Up:   See me in one year    Other Special Instructions:   Let me know if you have any more chest pain    If you need a refill on your cardiac medications before your next appointment, please call your pharmacy.   Call the [facility] office at [phone] if you have any questions, problems or concerns.

## 2016-10-17 LAB — CBC
Hematocrit: 31 % — ABNORMAL LOW (ref 34.0–46.6)
Hemoglobin: 10 g/dL — ABNORMAL LOW (ref 11.1–15.9)
MCH: 29.5 pg (ref 26.6–33.0)
MCHC: 32.3 g/dL (ref 31.5–35.7)
MCV: 91 fL (ref 79–97)
Platelets: 241 10*3/uL (ref 150–379)
RBC: 3.39 x10E6/uL — ABNORMAL LOW (ref 3.77–5.28)
RDW: 13.3 % (ref 12.3–15.4)
WBC: 6.6 10*3/uL (ref 3.4–10.8)

## 2016-10-17 LAB — BASIC METABOLIC PANEL
BUN/Creatinine Ratio: 26 (ref 12–28)
BUN: 26 mg/dL (ref 10–36)
CO2: 23 mmol/L (ref 18–29)
Calcium: 9.8 mg/dL (ref 8.7–10.3)
Chloride: 100 mmol/L (ref 96–106)
Creatinine, Ser: 1.01 mg/dL — ABNORMAL HIGH (ref 0.57–1.00)
GFR calc Af Amer: 57 mL/min/{1.73_m2} — ABNORMAL LOW (ref >59)
GFR calc non Af Amer: 49 mL/min/{1.73_m2} — ABNORMAL LOW (ref >59)
Glucose: 108 mg/dL — ABNORMAL HIGH (ref 65–99)
Potassium: 4.5 mmol/L (ref 3.5–5.2)
Sodium: 138 mmol/L (ref 134–144)

## 2016-10-17 LAB — TSH: TSH: 2.18 u[IU]/mL (ref 0.450–4.500)

## 2016-12-02 ENCOUNTER — Other Ambulatory Visit: Payer: Self-pay | Admitting: Nurse Practitioner

## 2017-04-07 ENCOUNTER — Encounter: Payer: Self-pay | Admitting: *Deleted

## 2017-05-22 ENCOUNTER — Encounter (INDEPENDENT_AMBULATORY_CARE_PROVIDER_SITE_OTHER): Payer: Self-pay | Admitting: Ophthalmology

## 2017-06-02 ENCOUNTER — Encounter (INDEPENDENT_AMBULATORY_CARE_PROVIDER_SITE_OTHER): Payer: Medicare HMO | Admitting: Ophthalmology

## 2017-06-02 DIAGNOSIS — H35033 Hypertensive retinopathy, bilateral: Secondary | ICD-10-CM | POA: Diagnosis not present

## 2017-06-02 DIAGNOSIS — H26493 Other secondary cataract, bilateral: Secondary | ICD-10-CM | POA: Diagnosis not present

## 2017-06-02 DIAGNOSIS — H43813 Vitreous degeneration, bilateral: Secondary | ICD-10-CM

## 2017-06-02 DIAGNOSIS — I10 Essential (primary) hypertension: Secondary | ICD-10-CM

## 2017-06-18 ENCOUNTER — Encounter (INDEPENDENT_AMBULATORY_CARE_PROVIDER_SITE_OTHER): Payer: Medicare HMO | Admitting: Ophthalmology

## 2017-07-21 ENCOUNTER — Encounter (INDEPENDENT_AMBULATORY_CARE_PROVIDER_SITE_OTHER): Payer: Medicare HMO | Admitting: Ophthalmology

## 2017-07-21 DIAGNOSIS — I10 Essential (primary) hypertension: Secondary | ICD-10-CM

## 2017-07-21 DIAGNOSIS — H35033 Hypertensive retinopathy, bilateral: Secondary | ICD-10-CM | POA: Diagnosis not present

## 2017-07-21 DIAGNOSIS — H26491 Other secondary cataract, right eye: Secondary | ICD-10-CM | POA: Diagnosis not present

## 2017-07-21 DIAGNOSIS — H2702 Aphakia, left eye: Secondary | ICD-10-CM

## 2017-08-04 ENCOUNTER — Encounter (INDEPENDENT_AMBULATORY_CARE_PROVIDER_SITE_OTHER): Payer: Medicare HMO | Admitting: Ophthalmology

## 2017-08-04 DIAGNOSIS — H2701 Aphakia, right eye: Secondary | ICD-10-CM

## 2017-09-30 ENCOUNTER — Other Ambulatory Visit: Payer: Self-pay | Admitting: Nurse Practitioner

## 2017-10-15 ENCOUNTER — Ambulatory Visit: Payer: Medicare HMO | Admitting: Nurse Practitioner

## 2017-10-15 ENCOUNTER — Encounter: Payer: Self-pay | Admitting: Nurse Practitioner

## 2017-10-15 VITALS — BP 136/60 | HR 72 | Wt 116.8 lb

## 2017-10-15 DIAGNOSIS — I10 Essential (primary) hypertension: Secondary | ICD-10-CM | POA: Diagnosis not present

## 2017-10-15 DIAGNOSIS — I259 Chronic ischemic heart disease, unspecified: Secondary | ICD-10-CM

## 2017-10-15 DIAGNOSIS — E7800 Pure hypercholesterolemia, unspecified: Secondary | ICD-10-CM

## 2017-10-15 NOTE — Patient Instructions (Addendum)
We will be checking the following labs today - NONE   Medication Instructions:    Continue with your current medicines.     Testing/Procedures To Be Arranged:  N/A  Follow-Up:   See me in one year.     Other Special Instructions:   N/A    If you need a refill on your cardiac medications before your next appointment, please call your pharmacy.   Call the [facility] office at [phone] if you have any questions, problems or concerns.      

## 2017-10-15 NOTE — Progress Notes (Signed)
CARDIOLOGY OFFICE NOTE  Date:  10/15/2017    Laura Woodard Date of Birth: 1924-03-08 Medical Record #[id number]  PCP:  Joette Catching, MD  Cardiologist:  Mechille Varghese & [place]    Chief Complaint  Patient presents with  . Coronary Artery Disease    1 year check - seen for Dr. [place]    History of Present Illness: Laura Woodard is a 84 y.o. female who presents today for a one year check. Seen for Dr. [place]. Former patient of Dr. Ronnald Nian.   She has known CAD with remote CABG in 2009, HTN and HLD.   Seen back in May of 2014 after going to [facility] for syncope in April of 2014. Records were reviewed and it was felt that she had a vasovagal faint and was probably volume depleted, over heated from sitting under a hair dryer and had a UTI. Did not get a CT of her head, nor carotid dopplers but had a CTA of the chest due to elevated d dimer and that was negative. Potassium was 3.2. HCTZ was cut out of her Lisinopril and she was placed on plain Lisinopril. Also she had stopped her Zoloft and did not wish to resume. She had been given some Ambien as well to help her sleep.   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 Corliss Skains - to be followed.  Also with some pulmonary nodules noted. Has remained on full dose aspirin.   In the ER back in January of 2018 with chest pain - called cardiology who advised increasing the Norvasc and follow up back here - this was not done.   I last saw her a year ago - she was doing ok. She had decided to not have any more scans on her neck nor follow up with Dr. Corliss Skains. Not as active but still quite sharp mentally.   Comes back today. Here with her son. She says she is doing ok. Less active. Still working on her puzzles. No chest pain. Breathing is ok. Poor appetite and sleep - this is chronic. She is seeing PCP later this month with labs. Son  feels like she is ok. They reiterate again today that she does not wish for any further evaluation for her aneurysm of the carotid. She has remained on high dose aspirin per her choice.   Past Medical History:  Diagnosis Date  . Arthritis   . Coronary artery disease    s/p CABG in November 2009  . GERD (gastroesophageal reflux disease)   . Headache(784.0)   . HOH (hard of hearing)   . Hyperlipidemia   . Hypertension     Past Surgical History:  Procedure Laterality Date  . ABDOMINAL HYSTERECTOMY    . CARDIAC CATHETERIZATION  05/16/2008   EF 70+  . CARDIOVASCULAR STRESS TEST  05/11/2008   EF 61%.ABNORMAL STRESS NUCLEAR STUDY. FINDINGS ARE CONSISTENT WITH ANTERIOR ISCHEMIA  . CATARACT EXTRACTION W/PHACO Right 12/13/2013   Procedure: CATARACT EXTRACTION PHACO AND INTRAOCULAR LENS PLACEMENT (IOC);  Surgeon: Gemma Payor, MD;  Location: [facility];  Service: Ophthalmology;  Laterality: Right;  CDE 7.89  . CATARACT EXTRACTION W/PHACO Left 01/06/2014   Procedure: CATARACT EXTRACTION PHACO AND INTRAOCULAR LENS PLACEMENT (IOC);  Surgeon: Gemma Payor, MD;  Location: [facility];  Service: Ophthalmology;  Laterality: Left;  CDE:9.23  . CHOLECYSTECTOMY    . CORONARY ARTERY BYPASS GRAFT  November  2009   LIMA to LAD, SVG to DX, SVG to 1st OM and SVG to RCA  . REPLACEMENT TOTAL KNEE BILATERAL    . US ECHOCARDIOGRAPHY  05/12/2008   EF 55-60%     Medications: Current Meds  Medication Sig  . acetaminophen (TYLENOL) 500 MG tablet Take 500 mg by mouth as needed for mild pain or headache.   Marland Kitchen aspirin EC 325 MG tablet Take 1 tablet (325 mg total) by mouth daily.  Marland Kitchen atorvastatin (LIPITOR) 20 MG tablet Take 20 mg by mouth at bedtime.   . Cholecalciferol (VITAMIN D) 2000 UNITS tablet Take 2,000 Units by mouth daily.  Marland Kitchen ENSURE (ENSURE) Take 237 mLs by mouth as needed.  . Ginkgo Biloba 120 MG CAPS Take 1 capsule by mouth daily.  Marland Kitchen levothyroxine (SYNTHROID, LEVOTHROID) 50 MCG tablet Take 50 mcg by mouth daily before  breakfast.  . lisinopril (PRINIVIL,ZESTRIL) 20 MG tablet TAKE ONE TABLET BY MOUTH ONCE DAILY  . metoprolol succinate (TOPROL-XL) 50 MG 24 hr tablet TAKE 1 TABLET BY MOUTH ONCE DAILY WITH OR IMMEDIATELY FOLLOWING A MEAL  . mirtazapine (REMERON) 15 MG tablet Take 15 mg by mouth at bedtime.  . Multiple Vitamin (MULTIVITAMIN) tablet Take 1 tablet by mouth daily.    . nitroGLYCERIN (NITROSTAT) 0.4 MG SL tablet Place 1 tablet (0.4 mg total) under the tongue every 5 (five) minutes as needed for chest pain.  . Omega-3 Fatty Acids (FISH OIL) 1200 MG CAPS Take 1 capsule by mouth daily.    . pantoprazole (PROTONIX) 40 MG tablet Take 40 mg by mouth daily.  . Simethicone (GAS-X PO) Take 1 tablet by mouth daily as needed. Indigestion   . vitamin B-12 (CYANOCOBALAMIN) 1000 MCG tablet Take 1,000 mcg by mouth daily.   . vitamin E 100 UNIT capsule Take 100 Units by mouth daily.  Marland Kitchen zolpidem (AMBIEN) 5 MG tablet Take 5 mg by mouth every other day.     Allergies: Allergies  Allergen Reactions  . Sulfa Antibiotics Hives and Itching  . Sulfamethoxazole-Trimethoprim Nausea Only    Social History: The patient  reports that she has never smoked. She has never used smokeless tobacco. She reports that she does not drink alcohol or use drugs.   Family History: The patient's family history includes Coronary artery disease in her father; Lung cancer in her mother.   Review of Systems: Please see the history of present illness.   Otherwise, the review of systems is positive for none.   All other systems are reviewed and negative.   Physical Exam: VS:  BP 136/60 (BP Location: Left Arm, Patient Position: Sitting, Cuff Size: Normal)   Pulse 72   Wt 116 lb 12.8 oz (53 kg)   BMI 21.36 kg/m  .  BMI Body mass index is 21.36 kg/m.  Wt Readings from Last 3 Encounters:  10/15/17 116 lb 12.8 oz (53 kg)  10/16/16 122 lb 12.8 oz (55.7 kg)  07/19/16 120 lb (54.4 kg)    General: Pleasant. Well developed, well  nourished and in no acute distress.   HEENT: Normal.  Neck: Supple, no JVD, carotid bruits, or masses noted.  Cardiac: Regular rate and rhythm. No murmurs, rubs, or gallops. No edema.  Respiratory:  Lungs are clear to auscultation bilaterally with normal work of breathing.  GI: Soft and nontender.  MS: No deformity or atrophy. Gait and ROM intact.  Skin: Warm and dry. Color is normal.  Neuro:  Strength and sensation are intact and no gross  focal deficits noted.  Psych: Alert, appropriate and with normal affect.   LABORATORY DATA:  EKG:  EKG is ordered today. This demonstrates NSR with 1st degree AV block. Septal Q's.  Lab Results  Component Value Date   WBC 6.6 10/16/2016   HGB 10.0 (L) 10/16/2016   HCT 31.0 (L) 10/16/2016   PLT 241 10/16/2016   GLUCOSE 108 (H) 10/16/2016   CHOL 120 06/14/2014   TRIG 143 06/14/2014   HDL 46 06/14/2014   LDLCALC 45 06/14/2014   ALT 15 07/19/2016   AST 27 07/19/2016   NA 138 10/16/2016   K 4.5 10/16/2016   CL 100 10/16/2016   CREATININE 1.01 (H) 10/16/2016   BUN 26 10/16/2016   CO2 23 10/16/2016   TSH 2.180 10/16/2016   INR 1.07 02/21/2015   HGBA1C 6.0 (H) 06/13/2014     BNP (last 3 results) No results for input(s): BNP in the last 8760 hours.  ProBNP (last 3 results) No results for input(s): PROBNP in the last 8760 hours.   Other Studies Reviewed Today:   Assessment/Plan: Echo Study Conclusions from 06/2014  - Left ventricle: The cavity size was normal. Systolic function was normal. The estimated ejection fraction was in the range of 60% to 65%. Wall motion was normal; there were no regional wall motion abnormalities. Doppler parameters are consistent with abnormal left ventricular relaxation (grade 1 diastolic dysfunction). Doppler parameters are consistent with high ventricular filling pressure. Mild concentric and moderate focal basal septal hypertrophy. - Aortic valve: Mildly calcified annulus.  Trileaflet; mildly thickened, mildly calcified leaflets. There was mild regurgitation. - Aorta: Mild ascending aortic dilatation. Maximum diameter 3.67 cm. - Mitral valve: There was mild to moderate regurgitation. - Left atrium: The atrium was mildly to moderately dilated. - Right atrium: The atrium was mildly to moderately dilated. - Tricuspid valve: There was mild-moderate regurgitation. - Pulmonary arteries: PA peak pressure: 35 mm Hg (S). Mildly elevated pulmonary pressures.  Assessment/Plan:  1. CAD - remote CABG in 2009 - she has no symptoms - I would favor conservative management.   2. Prior stroke noted in December 2015 - she has elected to keep herself on full dose aspirin.  3. Incidental aneurysm of the carotid - she has decided to not have any more follow up.   4. HTN - BP ok on her current regimen.   5. Prior syncope - without recurrence.   6. Advanced age. Seems to be holding her own. Still very sharp. No falls. Safety is primary goal.    Current medicines are reviewed with the patient today.  The patient does not have concerns regarding medicines other than what has been noted above.  The following changes have been made:  See above.  Labs/ tests ordered today include:    Orders Placed This Encounter  Procedures  . EKG 12-Lead     Disposition:   FU with me in one year.     Patient is agreeable to this plan and will call if any problems develop in the interim.   SignedNorma Fredrickson: Naomi Fitton, NP  10/15/2017 11:34 AM  [facility] [address] [place]  [address] Phone: [phone]([phone] Fax: [phone]

## 2017-10-22 ENCOUNTER — Ambulatory Visit: Payer: Medicare HMO | Admitting: Nurse Practitioner

## 2018-05-22 ENCOUNTER — Other Ambulatory Visit: Payer: Self-pay

## 2018-05-22 ENCOUNTER — Encounter (HOSPITAL_COMMUNITY): Payer: Self-pay | Admitting: Emergency Medicine

## 2018-05-22 ENCOUNTER — Emergency Department (HOSPITAL_COMMUNITY): Payer: Medicare HMO

## 2018-05-22 ENCOUNTER — Emergency Department (HOSPITAL_COMMUNITY)
Admission: EM | Admit: 2018-05-22 | Discharge: 2018-05-22 | Disposition: A | Discharge status: Home / Self Care | Payer: Medicare HMO | Attending: Emergency Medicine | Admitting: Emergency Medicine

## 2018-05-22 DIAGNOSIS — Z96652 Presence of left artificial knee joint: Secondary | ICD-10-CM | POA: Insufficient documentation

## 2018-05-22 DIAGNOSIS — Z79899 Other long term (current) drug therapy: Secondary | ICD-10-CM | POA: Insufficient documentation

## 2018-05-22 DIAGNOSIS — R42 Dizziness and giddiness: Secondary | ICD-10-CM | POA: Diagnosis present

## 2018-05-22 DIAGNOSIS — I2510 Atherosclerotic heart disease of native coronary artery without angina pectoris: Secondary | ICD-10-CM | POA: Diagnosis not present

## 2018-05-22 DIAGNOSIS — H538 Other visual disturbances: Secondary | ICD-10-CM | POA: Insufficient documentation

## 2018-05-22 DIAGNOSIS — Z96651 Presence of right artificial knee joint: Secondary | ICD-10-CM | POA: Diagnosis not present

## 2018-05-22 DIAGNOSIS — Z951 Presence of aortocoronary bypass graft: Secondary | ICD-10-CM | POA: Insufficient documentation

## 2018-05-22 DIAGNOSIS — Z8673 Personal history of transient ischemic attack (TIA), and cerebral infarction without residual deficits: Secondary | ICD-10-CM | POA: Diagnosis not present

## 2018-05-22 DIAGNOSIS — R55 Syncope and collapse: Secondary | ICD-10-CM

## 2018-05-22 DIAGNOSIS — Z7982 Long term (current) use of aspirin: Secondary | ICD-10-CM | POA: Diagnosis not present

## 2018-05-22 DIAGNOSIS — I10 Essential (primary) hypertension: Secondary | ICD-10-CM | POA: Insufficient documentation

## 2018-05-22 DIAGNOSIS — R531 Weakness: Secondary | ICD-10-CM | POA: Diagnosis not present

## 2018-05-22 DIAGNOSIS — R1110 Vomiting, unspecified: Secondary | ICD-10-CM | POA: Diagnosis not present

## 2018-05-22 LAB — CBC WITH DIFFERENTIAL/PLATELET
Abs Immature Granulocytes: 0.06 10*3/uL (ref 0.00–0.07)
BASOS ABS: 0 10*3/uL (ref 0.0–0.1)
Basophils Relative: 0 %
Eosinophils Absolute: 0.1 10*3/uL (ref 0.0–0.5)
Eosinophils Relative: 1 %
HCT: 35.8 % — ABNORMAL LOW (ref 36.0–46.0)
HEMOGLOBIN: 10.9 g/dL — AB (ref 12.0–15.0)
Immature Granulocytes: 1 %
LYMPHS PCT: 9 %
Lymphs Abs: 0.9 10*3/uL (ref 0.7–4.0)
MCH: 29 pg (ref 26.0–34.0)
MCHC: 30.4 g/dL (ref 30.0–36.0)
MCV: 95.2 fL (ref 80.0–100.0)
Monocytes Absolute: 0.2 10*3/uL (ref 0.1–1.0)
Monocytes Relative: 2 %
NEUTROS ABS: 9.5 10*3/uL — AB (ref 1.7–7.7)
Neutrophils Relative %: 87 %
Platelets: 233 10*3/uL (ref 150–400)
RBC: 3.76 MIL/uL — AB (ref 3.87–5.11)
RDW: 13.5 % (ref 11.5–15.5)
WBC: 10.8 10*3/uL — AB (ref 4.0–10.5)
nRBC: 0 % (ref 0.0–0.2)

## 2018-05-22 LAB — CBG MONITORING, ED: Glucose-Capillary: 102 mg/dL — ABNORMAL HIGH (ref 70–99)

## 2018-05-22 LAB — COMPREHENSIVE METABOLIC PANEL
ALBUMIN: 3.5 g/dL (ref 3.5–5.0)
ALK PHOS: 122 U/L (ref 38–126)
ALT: 13 U/L (ref 0–44)
AST: 23 U/L (ref 15–41)
Anion gap: 7 (ref 5–15)
BUN: 23 mg/dL (ref 8–23)
CALCIUM: 9.2 mg/dL (ref 8.9–10.3)
CO2: 22 mmol/L (ref 22–32)
CREATININE: 1.31 mg/dL — AB (ref 0.44–1.00)
Chloride: 108 mmol/L (ref 98–111)
GFR calc Af Amer: 40 mL/min — ABNORMAL LOW (ref >60)
GFR calc non Af Amer: 34 mL/min — ABNORMAL LOW (ref >60)
GLUCOSE: 121 mg/dL — AB (ref 70–99)
Potassium: 4.5 mmol/L (ref 3.5–5.1)
SODIUM: 137 mmol/L (ref 135–145)
Total Bilirubin: 0.7 mg/dL (ref 0.3–1.2)
Total Protein: 7 g/dL (ref 6.5–8.1)

## 2018-05-22 LAB — I-STAT TROPONIN, ED: Troponin i, poc: 0 ng/mL (ref 0.00–0.08)

## 2018-05-22 LAB — TROPONIN I: Troponin I: <0.03 ng/mL (ref <0.03)

## 2018-05-22 MED ORDER — SODIUM CHLORIDE 0.9 % IV BOLUS
500.0000 mL | Freq: Once | INTRAVENOUS | Status: AC
  Administered 2018-05-22: 500 mL via INTRAVENOUS

## 2018-05-22 NOTE — ED Triage Notes (Signed)
Son found the patient in the bathroom and she had vomited. [facility] reports she may have loss consciousness at some point as [facility]. Patient reports abdominal pain and has not had a BM in 2 days. Patient began vomiting in route and received a 500 bolus and 4mg  of zofran at 1210.

## 2018-05-22 NOTE — Discharge Instructions (Addendum)
Drink plenty of fluids and follow-up with your family doctor next week for recheck.  Return if any problems

## 2018-05-22 NOTE — ED Provider Notes (Signed)
[facility] EMERGENCY DEPARTMENT Provider Note   CSN: [id number] Arrival date & time: 05/22/18  1243     History   Chief Complaint Chief Complaint  Patient presents with  . Loss of Consciousness    HPI Laura Woodard is a 84 y.o. female.  Patient states she was in the restroom and threw up and then felt dizzy and weak.  She also states she has not had a bowel movement in 2 days.  Mild abdominal cramping.  When patient arrived in the emergency department she had a large bowel movement and felt better.  The history is provided by the patient and a relative. No language interpreter was used.  Loss of Consciousness   This is a new problem. The current episode started less than 1 hour ago. The problem occurs rarely. The problem has been resolved. There was no loss of consciousness. The problem is associated with normal activity. Associated symptoms include dizziness and visual change. Pertinent negatives include abdominal pain, back pain, chest pain, congestion, headaches and seizures. She has tried nothing for the symptoms. The treatment provided no relief. Her past medical history does not include CVA.    Past Medical History:  Diagnosis Date  . Arthritis   . Coronary artery disease    s/p CABG in November 2009  . GERD (gastroesophageal reflux disease)   . Headache(784.0)   . HOH (hard of hearing)   . Hyperlipidemia   . Hypertension     Patient Active Problem List   Diagnosis Date Noted  . Brain aneurysm   . CVA (cerebral infarction) 06/13/2014  . HTN (hypertension) 06/13/2014  . CAD (coronary artery disease) 04/22/2011  . Syncope 04/22/2011  . Hyperlipidemia 04/22/2011  . GERD (gastroesophageal reflux disease) 04/22/2011    Past Surgical History:  Procedure Laterality Date  . ABDOMINAL HYSTERECTOMY    . CARDIAC CATHETERIZATION  05/16/2008   EF 70+  . CARDIOVASCULAR STRESS TEST  05/11/2008   EF 61%.ABNORMAL STRESS NUCLEAR STUDY. FINDINGS ARE CONSISTENT WITH  ANTERIOR ISCHEMIA  . CATARACT EXTRACTION W/PHACO Right 12/13/2013   Procedure: CATARACT EXTRACTION PHACO AND INTRAOCULAR LENS PLACEMENT (IOC);  Surgeon: Gemma PayorKerry Hunt, MD;  Location: [facility];  Service: Ophthalmology;  Laterality: Right;  CDE 7.89  . CATARACT EXTRACTION W/PHACO Left 01/06/2014   Procedure: CATARACT EXTRACTION PHACO AND INTRAOCULAR LENS PLACEMENT (IOC);  Surgeon: Gemma PayorKerry Hunt, MD;  Location: [facility];  Service: Ophthalmology;  Laterality: Left;  CDE:9.23  . CHOLECYSTECTOMY    . CORONARY ARTERY BYPASS GRAFT  November 2009   LIMA to LAD, SVG to DX, SVG to 1st OM and SVG to RCA  . REPLACEMENT TOTAL KNEE BILATERAL    . US ECHOCARDIOGRAPHY  05/12/2008   EF 55-60%     OB History   None      Home Medications    Prior to Admission medications   Medication Sig Start Date End Date Taking? Authorizing Provider  acetaminophen (TYLENOL) 500 MG tablet Take 500 mg by mouth as needed for mild pain or headache.     [provider]  amLODipine (NORVASC) 10 MG tablet Take 10 mg by mouth daily.  07/31/16 07/31/17  [provider]  aspirin EC 325 MG tablet Take 1 tablet (325 mg total) by mouth daily. 06/14/14   Calvert Cantorizwan, Saima, MD  atorvastatin (LIPITOR) 20 MG tablet Take 20 mg by mouth at bedtime.     [provider]  Cholecalciferol (VITAMIN D) 2000 UNITS tablet Take 2,000 Units by mouth daily.  [provider]  ENSURE (ENSURE) Take 237 mLs by mouth as needed.    [provider]  Ginkgo Biloba 120 MG CAPS Take 1 capsule by mouth daily.    [provider]  levothyroxine (SYNTHROID, LEVOTHROID) 50 MCG tablet Take 50 mcg by mouth daily before breakfast.    [provider]  lisinopril (PRINIVIL,ZESTRIL) 20 MG tablet TAKE ONE TABLET BY MOUTH ONCE DAILY 12/03/16   Rosalio Macadamia, NP  metoprolol succinate (TOPROL-XL) 50 MG 24 hr tablet TAKE 1 TABLET BY MOUTH ONCE DAILY WITH OR IMMEDIATELY FOLLOWING A MEAL 09/30/17   Rosalio Macadamia, NP    mirtazapine (REMERON) 15 MG tablet Take 15 mg by mouth at bedtime. 03/01/14   [provider]  Multiple Vitamin (MULTIVITAMIN) tablet Take 1 tablet by mouth daily.      [provider]  nitroGLYCERIN (NITROSTAT) 0.4 MG SL tablet Place 1 tablet (0.4 mg total) under the tongue every 5 (five) minutes as needed for chest pain. 07/19/16   Bethann Berkshire, MD  Omega-3 Fatty Acids (FISH OIL) 1200 MG CAPS Take 1 capsule by mouth daily.      [provider]  pantoprazole (PROTONIX) 40 MG tablet Take 40 mg by mouth daily.    [provider]  Simethicone (GAS-X PO) Take 1 tablet by mouth daily as needed. Indigestion     [provider]  vitamin B-12 (CYANOCOBALAMIN) 1000 MCG tablet Take 1,000 mcg by mouth daily.     [provider]  vitamin E 100 UNIT capsule Take 100 Units by mouth daily.    [provider]  zolpidem (AMBIEN) 5 MG tablet Take 5 mg by mouth every other day.    [provider]    Family History Family History  Problem Relation Age of Onset  . Lung cancer Mother   . Coronary artery disease Father     Social History Social History   Tobacco Use  . Smoking status: Never Smoker  . Smokeless tobacco: Never Used  Substance Use Topics  . Alcohol use: No  . Drug use: No     Allergies   Sulfa antibiotics and Sulfamethoxazole-trimethoprim   Review of Systems Review of Systems  Constitutional: Negative for appetite change and fatigue.  HENT: Negative for congestion, ear discharge and sinus pressure.   Eyes: Negative for discharge.  Respiratory: Negative for cough.   Cardiovascular: Positive for syncope. Negative for chest pain.  Gastrointestinal: Negative for abdominal pain and diarrhea.  Genitourinary: Negative for frequency and hematuria.  Musculoskeletal: Negative for back pain.  Skin: Negative for rash.  Neurological: Positive for dizziness. Negative for seizures and headaches.  Psychiatric/Behavioral:  Negative for hallucinations.     Physical Exam Updated Vital Signs BP (!) 114/56   Pulse 62   Temp 97.8 F (36.6 C)   Resp (!) 21   SpO2 100%   Physical Exam  Constitutional: She is oriented to person, place, and time. She appears well-developed.  HENT:  Head: Normocephalic.  Eyes: Conjunctivae and EOM are normal. No scleral icterus.  Neck: Neck supple. No thyromegaly present.  Cardiovascular: Normal rate and regular rhythm. Exam reveals no gallop and no friction rub.  No murmur heard. Pulmonary/Chest: No stridor. She has no wheezes. She has no rales. She exhibits no tenderness.  Abdominal: She exhibits no distension. There is no tenderness. There is no rebound.  Musculoskeletal: Normal range of motion. She exhibits no edema.  Lymphadenopathy:    She has no cervical adenopathy.  Neurological: She is oriented to person, place, and time. She exhibits normal muscle tone. Coordination normal.  Skin: No rash noted. No erythema.  Psychiatric: She has a normal mood and affect. Her behavior is normal.     ED Treatments / Results  Labs (all labs ordered are listed, but only abnormal results are displayed) Labs Reviewed  CBC WITH DIFFERENTIAL/PLATELET - Abnormal; Notable for the following components:      Result Value   WBC 10.8 (*)    RBC 3.76 (*)    Hemoglobin 10.9 (*)    HCT 35.8 (*)    Neutro Abs 9.5 (*)    All other components within normal limits  COMPREHENSIVE METABOLIC PANEL - Abnormal; Notable for the following components:   Glucose, Bld 121 (*)    Creatinine, Ser 1.31 (*)    GFR calc non Af Amer 34 (*)    GFR calc Af Amer 40 (*)    All other components within normal limits  CBG MONITORING, ED - Abnormal; Notable for the following components:   Glucose-Capillary 102 (*)    All other components within normal limits  TROPONIN I  I-STAT TROPONIN, ED    EKG None  Radiology Ct Head Wo Contrast  Result Date: 05/22/2018 CLINICAL DATA:  Altered mental status  with questionable loss of consciousness. Vomiting. EXAM: CT HEAD WITHOUT CONTRAST TECHNIQUE: Contiguous axial images were obtained from the base of the skull through the vertex without intravenous contrast. COMPARISON:  Nov 20, 2015 FINDINGS: Brain: There is mild diffuse atrophy. There is mild invagination of CSF into the sella. There is no intracranial mass, hemorrhage, extra-axial fluid collection, or midline shift. There is patchy small vessel disease in the centra semiovale bilaterally. Brain parenchyma elsewhere appears unremarkable. No acute infarct evident. Vascular: No hyperdense vessel. There is calcification in the distal vertebral arteries and in each carotid siphon region. Skull: The bony calvarium appears intact. Sinuses/Orbits: There is mucosal thickening in several ethmoid air cells. Other visualized paranasal sinuses are clear. Frontal sinuses are aplastic. Visualized orbits appear symmetric bilaterally. Other: Mastoid air cells are clear. IMPRESSION: Mild atrophy with mild patchy periventricular small vessel disease, stable. No acute infarct. No mass or hemorrhage. There are foci of arterial vascular calcification. There is mucosal thickening in several ethmoid air cells. Electronically Signed   By: Bretta Bang III M.D.   On: 05/22/2018 13:58   Dg Abd Acute W/chest  Result Date: 05/22/2018 CLINICAL DATA:  Vomiting. Abdominal pain. EXAM: DG ABDOMEN ACUTE W/ 1V CHEST COMPARISON:  07/19/2016 chest radiograph FINDINGS: Sequelae of CABG are again identified. The cardiac silhouette remains mildly enlarged. Thoracic aortic tortuosity and atherosclerotic calcification are noted. No airspace consolidation, edema, pleural effusion, pneumothorax is identified. No intraperitoneal free air is identified. There are multiple loops of gas-filled small bowel in the lower abdomen and pelvis which are borderline dilated. Right upper quadrant abdominal surgical clips are noted. There is lumbar  dextroscoliosis. IMPRESSION: 1. Multiple loops of borderline dilated small bowel in the lower abdomen and pelvis, which could reflect ileus or early/partial obstruction. 2. No evidence of acute cardiopulmonary process. Electronically Signed   By: Sebastian Ache M.D.   On: 05/22/2018 14:48    Procedures Procedures (including critical care time)  Medications Ordered in ED Medications  sodium chloride 0.9 % bolus 500 mL (0 mLs Intravenous Stopped 05/22/18 1424)     Initial Impression / Assessment and Plan / ED Course  I have reviewed the triage vital signs and the nursing notes.  Pertinent labs & imaging results that were available during my care of the patient were reviewed by me and considered in my medical decision making (see chart for details).     Labs unremarkable.  CT scan unremarkable.  Abdominal series shows some air-fluid levels in her intestines.  Patient feeling much better will be discharged home to follow-up with her PCP  Final Clinical Impressions(s) / ED Diagnoses   Final diagnoses:  Syncope, unspecified syncope type    ED Discharge Orders    None       Bethann Berkshire, MD 05/22/18 1546

## 2018-05-22 NOTE — ED Notes (Signed)
Patient reports she was straining to have a BM when she felt as though she passed out after vomiting.

## 2018-09-17 ENCOUNTER — Other Ambulatory Visit: Payer: Self-pay | Admitting: Nurse Practitioner

## 2018-10-12 ENCOUNTER — Telehealth: Payer: Self-pay | Admitting: Nurse Practitioner

## 2018-10-12 NOTE — Telephone Encounter (Signed)
OV changed to virtual and appt.notes edited.

## 2018-10-12 NOTE — Telephone Encounter (Signed)
Changing OV for 4/13 at 11 AM to a phone visit. Patient and son are aware.   Rosalio Macadamia, RN, ANP-C [facility] [address] [place], [place]  [address] [phone]

## 2018-10-15 ENCOUNTER — Telehealth: Payer: Self-pay | Admitting: *Deleted

## 2018-10-15 NOTE — Telephone Encounter (Signed)
Virtual Visit Pre-Appointment Phone Call  Steps For Call:  1. Confirm consent - "In the setting of the current Covid19 crisis, you are scheduled for a (phone or video) visit with your provider on (date) at (time).  Just as we do with many in-office visits, in order for you to participate in this visit, we must obtain consent.  If you'd like, I can send this to your mychart (if signed up) or email for you to review.  Otherwise, I can obtain your verbal consent now.  All virtual visits are billed to your insurance company just like a normal visit would be.  By agreeing to a virtual visit, we'd like you to understand that the technology does not allow for your provider to perform an examination, and thus may limit your provider's ability to fully assess your condition.  Finally, though the technology is pretty good, we cannot assure that it will always work on either your or our end, and in the setting of a video visit, we may have to convert it to a phone-only visit.  In either situation, we cannot ensure that we have a secure connection.  Are you willing to proceed?"  2. Give patient instructions for WebEx download to smartphone as below if video visit  3. Advise patient to be prepared with any vital sign or heart rhythm information, their current medicines, and a piece of paper and pen handy for any instructions they may receive the day of their visit  4. Inform patient they will receive a phone call 15 minutes prior to their appointment time (may be from unknown caller ID) so they should be prepared to answer  5. Confirm that appointment type is correct in Epic appointment notes (video vs telephone)    TELEPHONE CALL NOTE  Laura Woodard has been deemed a candidate for a follow-up tele-health visit to limit community exposure during the Covid-19 pandemic. I spoke with the patient via phone to ensure availability of phone/video source, confirm preferred email & phone number, and discuss  instructions and expectations.  I reminded Laura Woodard to be prepared with any vital sign and/or heart rhythm information that could potentially be obtained via home monitoring, at the time of her visit. I reminded Laura Woodard to expect a phone call at the time of her visit if her visit. 10/19/18 @ 11:00 AM.  Did the patient verbally acknowledge consent to treatment? Son per (DPR) YES for phone.   Leanord AsalRobin  10/15/2018 8:57 AM   DOWNLOADING THE WEBEX SOFTWARE TO SMARTPHONE  - If Apple, go to [employer] Store and type in WebEx in the search bar. Download [employer] Meetings, the blue/green circle. The app is free but as with any other app downloads, their phone may require them to verify saved payment information or Apple password. The patient does NOT have to create an account.  - If Android, ask patient to go to [employer] Play Store and type in WebEx in the search bar. Download [employer] Meetings, the blue/green circle. The app is free but as with any other app downloads, their phone may require them to verify saved payment information or Android password. The patient does NOT have to create an account.   CONSENT FOR TELE-HEALTH VISIT - PLEASE REVIEW  I hereby voluntarily request, consent and authorize [facility] and its employed or contracted physicians, Producer, television/film/videophysician assistants, nurse practitioners or other licensed health care professionals Laura Woodard(Lori Gerhardt, NP ), to provide me with telemedicine health care  services (the "Phone") as deemed necessary by the treating Practitioner. I acknowledge and consent to receive the Services by the Practitioner via telemedicine. I understand that the telemedicine visit will involve communicating with the Practitioner through live audiovisual communication technology and the disclosure of certain medical information by electronic transmission. I acknowledge that I have been given the opportunity to request an in-person assessment or other  available alternative prior to the telemedicine visit and am voluntarily participating in the telemedicine visit.  I understand that I have the right to withhold or withdraw my consent to the use of telemedicine in the course of my care at any time, without affecting my right to future care or treatment, and that the Practitioner or I may terminate the telemedicine visit at any time. I understand that I have the right to inspect all information obtained and/or recorded in the course of the telemedicine visit and may receive copies of available information for a reasonable fee.  I understand that some of the potential risks of receiving the Services via telemedicine include:  Marland Kitchen Delay or interruption in medical evaluation due to technological equipment failure or disruption; . Information transmitted may not be sufficient (e.g. poor resolution of images) to allow for appropriate medical decision making by the Practitioner; and/or  . In rare instances, security protocols could fail, causing a breach of personal health information.  Furthermore, I acknowledge that it is my responsibility to provide information about my medical history, conditions and care that is complete and accurate to the best of my ability. I acknowledge that Practitioner's advice, recommendations, and/or decision may be based on factors not within their control, such as incomplete or inaccurate data provided by me or distortions of diagnostic images or specimens that may result from electronic transmissions. I understand that the practice of medicine is not an exact science and that Practitioner makes no warranties or guarantees regarding treatment outcomes. I acknowledge that I will receive a copy of this consent concurrently upon execution via email to the email address I last provided but may also request a printed copy by calling the office of [facility].    I understand that my insurance will be billed for this visit.   I have  read or had this consent read to me. . I understand the contents of this consent, which adequately explains the benefits and risks of the Services being provided via telemedicine.  . I have been provided ample opportunity to ask questions regarding this consent and the Services and have had my questions answered to my satisfaction. . I give my informed consent for the services to be provided through the use of telemedicine in my medical care  By participating in this telemedicine visit I agree to the above.

## 2018-10-18 NOTE — Progress Notes (Signed)
Telehealth Visit     Virtual Visit via Telephone Note   This visit type was conducted due to national recommendations for restrictions regarding the COVID-19 Pandemic (e.g. social distancing) in an effort to limit this patient's exposure and mitigate transmission in our community.  Due to her co-morbid illnesses, this patient is at least at moderate risk for complications without adequate follow up.  This format is felt to be most appropriate for this patient at this time.  The patient did not have access to video technology/had technical difficulties with video requiring transitioning to audio format only (telephone).  All issues noted in this document were discussed and addressed.  No physical exam could be performed with this format.  Please refer to the patient's chart for her  consent to telehealth for [facility].   Evaluation Performed:  Follow-up visit  This visit type was conducted due to national recommendations for restrictions regarding the COVID-19 Pandemic (e.g. social distancing).  This format is felt to be most appropriate for this patient at this time.  All issues noted in this document were discussed and addressed.  No physical exam was performed (except for noted visual exam findings with Video Visits).  Please refer to the patient's chart (MyChart message for video visits and phone note for telephone visits) for the patient's consent to telehealth for [facility].  Date:  10/19/2018   ID:  Laura CrazeJuanita J Tarbet, DOB 05/26/1924, MRN [id number]  Patient Location:  Home  Provider location:   Home  PCP:  Joette CatchingNyland, Leonard, MD  Cardiologist:  Tyrone SageGerhardt  Electrophysiologist:  None   Chief Complaint:  1 year check.   History of Present Illness:    Laura CrazeJuanita J Woodard is a [age] y.o. female who presents via audio/video conferencing for a telehealth visit today.  Seen for Dr. [place]. Former patient of Dr. Ronnald Nianennant's. Primarily follows with me.   She has known CAD with remote CABG in  2009, HTN and HLD.   Seen back in May of 2014 after going to [facility] in April of 2014. Records were reviewed and it was felt that she had a vasovagal faint and was probably volume depleted, over heated from sitting under a hair dryer and had a UTI. Did not get a CT of her head, nor carotid dopplers but had a CTA of the chest due to elevated d dimer and that was negative. Potassium was 3.2. HCTZ was cut out of her Lisinopril and she was placed on plain Lisinopril. Also she had stopped her Zoloft and did not wish to resume. She had been given some Ambien as well to help her sleep.   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 Corliss Skainseveshwar - did not wish to have further evaluation in light of her advancing age. She has had pulmonary nodules noted. Has remained on full dose aspirin per her choice.   In the ER back in January of 2018 with chest pain - called cardiology who advised increasing the Norvasc and follow up back here - this was not done.  I last saw her a year ago - she has done ok. She likes puzzles. She was slowing down physically but still pretty sharp mentally. Cardiac status ok.   The patient does not have symptoms concerning for COVID-19 infection (fever, chills, cough, or new shortness of breath).   Seen today via telephone conversation. She only has a  landline phone - no internet.  Her son was present as well on the other line. She has consented for this visit. She is quite hard of hearing. She is doing ok. No chest pain. Breathing is stable. She is continuing to do her puzzles. No falls. Has had bladder infections - ended up seeing what sounds like urology - this turned out ok. Her back has tended to hurt some - has it today - ?weather related with all the storms we are having. Overall, she feels like she is ok. Her appetite is down - her weight is down as well. BP is good.  Tolerating her medicines.   Past Medical History:  Diagnosis Date  . Arthritis   . Coronary artery disease    s/p CABG in November 2009  . GERD (gastroesophageal reflux disease)   . Headache(784.0)   . HOH (hard of hearing)   . Hyperlipidemia   . Hypertension    Past Surgical History:  Procedure Laterality Date  . ABDOMINAL HYSTERECTOMY    . CARDIAC CATHETERIZATION  05/16/2008   EF 70+  . CARDIOVASCULAR STRESS TEST  05/11/2008   EF 61%.ABNORMAL STRESS NUCLEAR STUDY. FINDINGS ARE CONSISTENT WITH ANTERIOR ISCHEMIA  . CATARACT EXTRACTION W/PHACO Right 12/13/2013   Procedure: CATARACT EXTRACTION PHACO AND INTRAOCULAR LENS PLACEMENT (IOC);  Surgeon: Gemma Payor, MD;  Location: [facility];  Service: Ophthalmology;  Laterality: Right;  CDE 7.89  . CATARACT EXTRACTION W/PHACO Left 01/06/2014   Procedure: CATARACT EXTRACTION PHACO AND INTRAOCULAR LENS PLACEMENT (IOC);  Surgeon: Gemma Payor, MD;  Location: [facility];  Service: Ophthalmology;  Laterality: Left;  CDE:9.23  . CHOLECYSTECTOMY    . CORONARY ARTERY BYPASS GRAFT  November 2009   LIMA to LAD, SVG to DX, SVG to 1st OM and SVG to RCA  . REPLACEMENT TOTAL KNEE BILATERAL    . US ECHOCARDIOGRAPHY  05/12/2008   EF 55-60%     Current Meds  Medication Sig  . acetaminophen (TYLENOL) 500 MG tablet Take 500 mg by mouth as needed for mild pain or headache.   Marland Kitchen amLODipine (NORVASC) 10 MG tablet Take 10 mg by mouth daily.   Marland Kitchen aspirin EC 325 MG tablet Take 1 tablet (325 mg total) by mouth daily.  Marland Kitchen atorvastatin (LIPITOR) 20 MG tablet Take 20 mg by mouth at bedtime.   . B Complex Vitamins (VITAMIN B COMPLEX 100) INJ Inject 1 Units as directed every 30 (thirty) days.  . ENSURE (ENSURE) Take 237 mLs by mouth as needed.  Marland Kitchen levothyroxine (SYNTHROID, LEVOTHROID) 50 MCG tablet Take 50 mcg by mouth daily before breakfast.  . lisinopril (PRINIVIL,ZESTRIL) 20 MG tablet TAKE ONE TABLET BY MOUTH ONCE DAILY  . metoprolol succinate (TOPROL-XL) 50 MG 24 hr tablet Take 1  tablet (50 mg total) by mouth daily. Please keep upcoming appt for future refills. Thank you  . mirtazapine (REMERON) 15 MG tablet Take 15 mg by mouth at bedtime.  . Multiple Vitamin (MULTIVITAMIN WITH MINERALS) TABS tablet Take 1 tablet by mouth daily.  . nitroGLYCERIN (NITROSTAT) 0.4 MG SL tablet Place 1 tablet (0.4 mg total) under the tongue every 5 (five) minutes as needed for chest pain.  . pantoprazole (PROTONIX) 40 MG tablet Take 40 mg by mouth daily.  . Simethicone (GAS-X PO) Take 1 tablet by mouth daily as needed. Indigestion      Allergies:   Sulfa antibiotics and Sulfamethoxazole-trimethoprim   Social History   Tobacco Use  . Smoking status: Never Smoker  . Smokeless tobacco:  Never Used  Substance Use Topics  . Alcohol use: No  . Drug use: No     Family Hx: The patient's family history includes Coronary artery disease in her father; Lung cancer in her mother.  ROS:   Please see the history of present illness.   All other systems reviewed are negative except for hard of hearing.    Objective:    Vital Signs:  BP 119/66   Pulse 86   Wt 112 lb 3.2 oz (50.9 kg)   BMI 20.52 kg/m    Wt Readings from Last 3 Encounters:  10/19/18 112 lb 3.2 oz (50.9 kg)  10/15/17 116 lb 12.8 oz (53 kg)  10/16/16 122 lb 12.8 oz (55.7 kg)    Alert and in no acute distress.  She is hard of hearing. She is not short of breath with conversation.    Labs/Other Tests and Data Reviewed:    Lab Results  Component Value Date   WBC 10.8 (H) 05/22/2018   HGB 10.9 (L) 05/22/2018   HCT 35.8 (L) 05/22/2018   PLT 233 05/22/2018   GLUCOSE 121 (H) 05/22/2018   CHOL 120 06/14/2014   TRIG 143 06/14/2014   HDL 46 06/14/2014   LDLCALC 45 06/14/2014   ALT 13 05/22/2018   AST 23 05/22/2018   NA 137 05/22/2018   K 4.5 05/22/2018   CL 108 05/22/2018   CREATININE 1.31 (H) 05/22/2018   BUN 23 05/22/2018   CO2 22 05/22/2018   TSH 2.180 10/16/2016   INR 1.07 02/21/2015   HGBA1C 6.0 (H)  06/13/2014       BNP (last 3 results) No results for input(s): BNP in the last 8760 hours.  ProBNP (last 3 results) No results for input(s): PROBNP in the last 8760 hours.    Prior CV studies:    The following studies were reviewed today:  Echo Study Conclusions from 06/2014  - Left ventricle: The cavity size was normal. Systolic function was normal. The estimated ejection fraction was in the range of 60% to 65%. Wall motion was normal; there were no regional wall motion abnormalities. Doppler parameters are consistent with abnormal left ventricular relaxation (grade 1 diastolic dysfunction). Doppler parameters are consistent with high ventricular filling pressure. Mild concentric and moderate focal basal septal hypertrophy. - Aortic valve: Mildly calcified annulus. Trileaflet; mildly thickened, mildly calcified leaflets. There was mild regurgitation. - Aorta: Mild ascending aortic dilatation. Maximum diameter 3.67 cm. - Mitral valve: There was mild to moderate regurgitation. - Left atrium: The atrium was mildly to moderately dilated. - Right atrium: The atrium was mildly to moderately dilated. - Tricuspid valve: There was mild-moderate regurgitation. - Pulmonary arteries: PA peak pressure: 35 mm Hg (S). Mildly elevated pulmonary pressures.  ASSESSMENT & PLAN:    1. CAD - remote CABG in 2009 - she has no active symptoms. Would favor conservative management.   2. Prior stroke noted in December 2015 - she has elected to keep herself on full dose aspirin. No problems noted.   3. Incidental aneurysm of the carotid - she has decided to not have any more follow up.Not discussed today.   4. HTN - Her BP is fine - no changes made today.   5. Prior syncope - no recurrence.  6. Advanced age - she seems to be holding her own. Pretty sharp with decreased hearing. Safety remains primary goal. Her appetite is down and her weight reflects this.     7. COVID-19 Education: The signs and  symptoms of COVID-19 were discussed with the patient and how to seek care for testing (follow up with PCP or arrange E-visit).  The importance of social distancing, staying at home and hand hygiene were discussed today.  Patient Risk:   After full review of this patient's clinical status, I feel that they are at least moderate risk at this time.  Time:   Today, I have spent 15 minutes with the patient with telehealth technology discussing the above issues.     Medication Adjustments/Labs and Tests Ordered: Current medicines are reviewed at length with the patient today.  Concerns regarding medicines are outlined above.   Tests Ordered: No orders of the defined types were placed in this encounter.   Medication Changes: No orders of the defined types were placed in this encounter.   Disposition:  FU with me in one year.   Patient is agreeable to this plan and will call if any problems develop in the interim.   Avelina Laine, NP  10/19/2018 11:15 AM    [facility]

## 2018-10-19 ENCOUNTER — Encounter: Payer: Self-pay | Admitting: Nurse Practitioner

## 2018-10-19 ENCOUNTER — Telehealth (INDEPENDENT_AMBULATORY_CARE_PROVIDER_SITE_OTHER): Payer: Medicare HMO | Admitting: Nurse Practitioner

## 2018-10-19 ENCOUNTER — Other Ambulatory Visit: Payer: Self-pay

## 2018-10-19 VITALS — BP 119/66 | HR 86 | Wt 112.2 lb

## 2018-10-19 DIAGNOSIS — Z8673 Personal history of transient ischemic attack (TIA), and cerebral infarction without residual deficits: Secondary | ICD-10-CM

## 2018-10-19 DIAGNOSIS — I720 Aneurysm of carotid artery: Secondary | ICD-10-CM | POA: Diagnosis not present

## 2018-10-19 DIAGNOSIS — I10 Essential (primary) hypertension: Secondary | ICD-10-CM

## 2018-10-19 DIAGNOSIS — Z7982 Long term (current) use of aspirin: Secondary | ICD-10-CM

## 2018-10-19 DIAGNOSIS — I2510 Atherosclerotic heart disease of native coronary artery without angina pectoris: Secondary | ICD-10-CM

## 2018-10-19 DIAGNOSIS — H9190 Unspecified hearing loss, unspecified ear: Secondary | ICD-10-CM | POA: Diagnosis not present

## 2018-10-19 DIAGNOSIS — E7800 Pure hypercholesterolemia, unspecified: Secondary | ICD-10-CM

## 2018-10-19 DIAGNOSIS — I259 Chronic ischemic heart disease, unspecified: Secondary | ICD-10-CM

## 2018-10-19 DIAGNOSIS — Z8679 Personal history of other diseases of the circulatory system: Secondary | ICD-10-CM

## 2018-10-19 DIAGNOSIS — Z7189 Other specified counseling: Secondary | ICD-10-CM

## 2018-10-19 DIAGNOSIS — Z951 Presence of aortocoronary bypass graft: Secondary | ICD-10-CM

## 2018-10-19 NOTE — Patient Instructions (Addendum)
After Visit Summary:  We will be checking the following labs today - NONE   Medication Instructions:    Continue with your current medicines.    If you need a refill on your cardiac medications before your next appointment, please call your pharmacy.     Testing/Procedures To Be Arranged:  N/A  Follow-Up:   See me in one year.     At [facility], you and your health needs are our priority.  As part of our continuing mission to provide you with exceptional heart care, we have created designated Provider Care Teams.  These Care Teams include your primary Cardiologist (physician) and Advanced Practice Providers (APPs -  Physician Assistants and Nurse Practitioners) who all work together to provide you with the care you need, when you need it.  Special Instructions:  . Stay safe, stay home and wash your hands for at least 20 seconds! . It was good to talk with you and your son today.  Marland Kitchen Keep a check on your blood pressure for [place] - if it starts running less than 110 systolic (which may happen if continues to lose weight) - let me know - we will need to cut back some of her medicines.   Call the [facility] office at [phone] if you have any questions, problems or concerns.

## 2018-12-14 ENCOUNTER — Other Ambulatory Visit: Payer: Self-pay | Admitting: Nurse Practitioner

## 2019-03-29 ENCOUNTER — Encounter: Payer: Self-pay | Admitting: *Deleted

## 2019-09-11 ENCOUNTER — Other Ambulatory Visit: Payer: Self-pay | Admitting: Nurse Practitioner

## 2019-10-18 ENCOUNTER — Ambulatory Visit: Payer: Medicare HMO | Admitting: Nurse Practitioner

## 2019-10-19 NOTE — Progress Notes (Signed)
CARDIOLOGY OFFICE NOTE  Date:  10/25/2019    Laura Woodard Date of Birth: December 27, 1923 Medical Record #[id number]  PCP:  Rebekah Chesterfield, NP  Cardiologist:  Caili Escalera & [place]   Chief Complaint  Patient presents with  . Follow-up    Seen for Dr. [place]    History of Present Illness: Laura Woodard is a 86 y.o. female who presents today for a one year check. Seen for Dr. [place]. Former patient of Dr. Ronnald Nian. Primarily follows with me.   She has known CAD with remote CABG in 2009, HTN and HLD.   Seen back in May of 2014 after going to [facility] for syncope in April of 2014. Records were reviewed and it was felt that she had a vasovagal faint and was probably volume depleted, over heated from sitting under a hair dryer and had a UTI. Did not get a CT of her head, nor carotid dopplers but had a CTA of the chest due to elevated d dimer and that was negative. Potassium was 3.2. HCTZ was cut out of her Lisinopril and she was placed on plain Lisinopril. Also she had stopped her Zoloft and did not wish to resume. She had been given some Ambien as well to help her sleep.   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 Corliss Skains - did not wish to have further evaluation in light of her advancing age. She has had pulmonary nodules noted.Has remained on full dose aspirin per her choice.   In the ER back in [place] 2018with chest pain - called cardiology who advised increasing the Norvasc and follow up back here - this was not done.She has basically done ok since - has slowed down - still very mentally sharp. We did a telehealth visit a year ago due to the pandemic.   The patient does not have symptoms concerning for COVID-19 infection (fever, chills, cough, or new shortness of breath).   Comes in today. Here with her son today. She is doing ok. He augments the history. Notes that  her memory is starting to get a little "off". He has taken over her medicines - apparently was missing some/doubling others, etc. They have arranged new primary care with Dr. Lysbeth Galas retiring - sounds like more for if a problem came up - she has had no recent labs noted - wanting to do here today. No chest pain. Breathing is ok. Still lives alone - does her own housework. No passing out. Overall no real concerns. No interest in vaccine for COVID.   Past Medical History:  Diagnosis Date  . Arthritis   . Coronary artery disease    s/p CABG in November 2009  . GERD (gastroesophageal reflux disease)   . Headache(784.0)   . HOH (hard of hearing)   . Hyperlipidemia   . Hypertension     Past Surgical History:  Procedure Laterality Date  . ABDOMINAL HYSTERECTOMY    . CARDIAC CATHETERIZATION  05/16/2008   EF 70+  . CARDIOVASCULAR STRESS TEST  05/11/2008   EF 61%.ABNORMAL STRESS NUCLEAR STUDY. FINDINGS ARE CONSISTENT WITH ANTERIOR ISCHEMIA  . CATARACT EXTRACTION W/PHACO Right 12/13/2013   Procedure: CATARACT EXTRACTION PHACO AND INTRAOCULAR LENS PLACEMENT (IOC);  Surgeon: Gemma Payor, MD;  Location: [facility];  Service: Ophthalmology;  Laterality: Right;  CDE 7.89  . CATARACT EXTRACTION W/PHACO Left 01/06/2014   Procedure: CATARACT EXTRACTION  PHACO AND INTRAOCULAR LENS PLACEMENT (IOC);  Surgeon: Tonny Branch, MD;  Location: [facility];  Service: Ophthalmology;  Laterality: Left;  CDE:9.23  . CHOLECYSTECTOMY    . CORONARY ARTERY BYPASS GRAFT  November 2009   LIMA to LAD, SVG to DX, SVG to 1st OM and SVG to RCA  . REPLACEMENT TOTAL KNEE BILATERAL    . US ECHOCARDIOGRAPHY  05/12/2008   EF 55-60%    Medications: Current Meds  Medication Sig  . acetaminophen (TYLENOL) 500 MG tablet Take 500 mg by mouth as needed for mild pain or headache.   Marland Kitchen amLODipine (NORVASC) 10 MG tablet Take 10 mg by mouth daily.   Marland Kitchen aspirin EC 325 MG tablet Take 1 tablet (325 mg total) by mouth daily.  Marland Kitchen atorvastatin (LIPITOR) 20 MG  tablet Take 20 mg by mouth at bedtime.   . B Complex Vitamins (VITAMIN B COMPLEX 100) INJ Inject 1 Units as directed every 30 (thirty) days.  Marland Kitchen CRANBERRY PO Take 1 capsule by mouth 2 (two) times daily.  . Cyanocobalamin (B-12 COMPLIANCE INJECTION IJ) Inject 1 each as directed every 30 (thirty) days.  . ENSURE (ENSURE) Take 237 mLs by mouth as needed.  Marland Kitchen levothyroxine (SYNTHROID, LEVOTHROID) 50 MCG tablet Take 50 mcg by mouth daily before breakfast.  . lisinopril (PRINIVIL,ZESTRIL) 20 MG tablet TAKE ONE TABLET BY MOUTH ONCE DAILY  . metoprolol succinate (TOPROL-XL) 50 MG 24 hr tablet TAKE 1 TABLET BY MOUTH ONCE DAILY (KEEP  UPCOMING  APPOINTMENT  FOR  FUTURE  REFILLS)  . mirtazapine (REMERON) 15 MG tablet Take 15 mg by mouth at bedtime.  . Multiple Vitamin (MULTIVITAMIN WITH MINERALS) TABS tablet Take 1 tablet by mouth daily.  . nitroGLYCERIN (NITROSTAT) 0.4 MG SL tablet Place 1 tablet (0.4 mg total) under the tongue every 5 (five) minutes as needed for chest pain.  . pantoprazole (PROTONIX) 40 MG tablet Take 40 mg by mouth daily.  . Simethicone (GAS-X PO) Take 1 tablet by mouth daily as needed. Indigestion      Allergies: Allergies  Allergen Reactions  . Sulfa Antibiotics Hives and Itching  . Sulfamethoxazole-Trimethoprim Nausea Only    Social History: The patient  reports that she has never smoked. She has never used smokeless tobacco. She reports that she does not drink alcohol or use drugs.   Family History: The patient's family history includes Coronary artery disease in her father; Lung cancer in her mother.   Review of Systems: Please see the history of present illness.   All other systems are reviewed and negative.   Physical Exam: VS:  BP (!) 156/72   Pulse 85   Ht 5\' 2"  (1.575 m)   Wt 113 lb 3.2 oz (51.3 kg)   SpO2 99%   BMI 20.70 kg/m  .  BMI Body mass index is 20.7 kg/m.  Wt Readings from Last 3 Encounters:  10/25/19 113 lb 3.2 oz (51.3 kg)  10/19/18 112 lb 3.2  oz (50.9 kg)  10/15/17 116 lb 12.8 oz (53 kg)   BP is 140/70 by me.   General: Pleasant. Well developed, well nourished and in no acute distress.   HEENT: Normal.  Neck: Supple, no JVD, carotid bruits, or masses noted.  Cardiac: Regular rate and rhythm. No murmurs, rubs, or gallops. No edema.  Respiratory:  Lungs are clear to auscultation bilaterally with normal work of breathing.  GI: Soft and nontender.  MS: No deformity or atrophy. Gait and ROM intact.  Skin: Warm and dry.  Color is normal.  Neuro:  Strength and sensation are intact and no gross focal deficits noted.  Psych: Alert, appropriate and with normal affect.   LABORATORY DATA:  EKG:  EKG is ordered today.  Personally reviewed by me. This demonstrates NSR with 1st degree AV block - HR is 85.  Lab Results  Component Value Date   WBC 10.8 (H) 05/22/2018   HGB 10.9 (L) 05/22/2018   HCT 35.8 (L) 05/22/2018   PLT 233 05/22/2018   GLUCOSE 121 (H) 05/22/2018   CHOL 120 06/14/2014   TRIG 143 06/14/2014   HDL 46 06/14/2014   LDLCALC 45 06/14/2014   ALT 13 05/22/2018   AST 23 05/22/2018   NA 137 05/22/2018   K 4.5 05/22/2018   CL 108 05/22/2018   CREATININE 1.31 (H) 05/22/2018   BUN 23 05/22/2018   CO2 22 05/22/2018   TSH 2.180 10/16/2016   INR 1.07 02/21/2015   HGBA1C 6.0 (H) 06/13/2014     BNP (last 3 results) No results for input(s): BNP in the last 8760 hours.  ProBNP (last 3 results) No results for input(s): PROBNP in the last 8760 hours.   Other Studies Reviewed Today:  Echo Study Conclusions from 06/2014  - Left ventricle: The cavity size was normal. Systolic function was normal. The estimated ejection fraction was in the range of 60% to 65%. Wall motion was normal; there were no regional wall motion abnormalities. Doppler parameters are consistent with abnormal left ventricular relaxation (grade 1 diastolic dysfunction). Doppler parameters are consistent with high ventricular filling  pressure. Mild concentric and moderate focal basal septal hypertrophy. - Aortic valve: Mildly calcified annulus. Trileaflet; mildly thickened, mildly calcified leaflets. There was mild regurgitation. - Aorta: Mild ascending aortic dilatation. Maximum diameter 3.67 cm. - Mitral valve: There was mild to moderate regurgitation. - Left atrium: The atrium was mildly to moderately dilated. - Right atrium: The atrium was mildly to moderately dilated. - Tricuspid valve: There was mild-moderate regurgitation. - Pulmonary arteries: PA peak pressure: 35 mm Hg (S). Mildly elevated pulmonary pressures.  ASSESSMENT & PLAN:    1. CAD with remote CABG from 2009 - no worrisome symptoms - would favor conservative management.   2. Prior stroke from 2015 - she has wished to remain on full dose aspirin.   3. HTN - recheck by me is 140/60 - no changes made today. Would continue beta blocker and ARB therapy.  4. HLD - on statin - lab today - could consider stopping if she wished.   5. Prior finding of incidental aneurysm of the carotid - she has declined any further testing/follow up - not discussed today.   6. Remote syncope - this has not recurred.   7. Advanced age - turning 21 later this year - memory starting to decline but overall still does very well.   8. COVID-19 Education: The signs and symptoms of COVID-19 were discussed with the patient and how to seek care for testing (follow up with PCP or arrange E-visit).  The importance of social distancing, staying at home, hand hygiene and wearing a mask when out in public were discussed today. Not interested in COVID vaccine.   Current medicines are reviewed with the patient today.  The patient does not have concerns regarding medicines other than what has been noted above.  The following changes have been made:  See above.  Labs/ tests ordered today include:    Orders Placed This Encounter  Procedures  . Basic metabolic panel  .  CBC  . Hepatic function panel  . Lipid panel  . TSH  . EKG 12-Lead     Disposition:   FU with [place] in one year.   Patient is agreeable to this plan and will call if any problems develop in the interim.   SignedNorma Fredrickson, NP  10/25/2019 12:10 PM  [facility] [address] [place], [place]  [address] Phone: [phone] Fax: [phone]

## 2019-10-25 ENCOUNTER — Encounter: Payer: Self-pay | Admitting: Nurse Practitioner

## 2019-10-25 ENCOUNTER — Other Ambulatory Visit: Payer: Self-pay

## 2019-10-25 ENCOUNTER — Ambulatory Visit: Payer: Medicare HMO | Admitting: Nurse Practitioner

## 2019-10-25 VITALS — BP 156/72 | HR 85 | Ht 62.0 in | Wt 113.2 lb

## 2019-10-25 DIAGNOSIS — Z7189 Other specified counseling: Secondary | ICD-10-CM

## 2019-10-25 DIAGNOSIS — I259 Chronic ischemic heart disease, unspecified: Secondary | ICD-10-CM

## 2019-10-25 DIAGNOSIS — I10 Essential (primary) hypertension: Secondary | ICD-10-CM

## 2019-10-25 DIAGNOSIS — E7800 Pure hypercholesterolemia, unspecified: Secondary | ICD-10-CM | POA: Diagnosis not present

## 2019-10-25 DIAGNOSIS — R634 Abnormal weight loss: Secondary | ICD-10-CM

## 2019-10-25 NOTE — Patient Instructions (Signed)
After Visit Summary:  We will be checking the following labs today - BMET, CBC, HPF, Lipids and TSH   Medication Instructions:    Continue with your current medicines.    If you need a refill on your cardiac medications before your next appointment, please call your pharmacy.     Testing/Procedures To Be Arranged:  N/A  Follow-Up:   See me in a year -  You will receive a reminder letter in the mail two months in advance. If you don't receive a letter, please call our office to schedule the follow-up appointment.     At [facility], you and your health needs are our priority.  As part of our continuing mission to provide you with exceptional heart care, we have created designated Provider Care Teams.  These Care Teams include your primary Cardiologist (physician) and Advanced Practice Providers (APPs -  Physician Assistants and Nurse Practitioners) who all work together to provide you with the care you need, when you need it.  Special Instructions:  . Stay safe, stay home, wash your hands for at least 20 seconds and wear a mask when out in public.  . It was good to talk with you today.    Call the [facility] office at [phone] if you have any questions, problems or concerns.

## 2019-10-26 LAB — TSH: TSH: 1.98 u[IU]/mL (ref 0.450–4.500)

## 2019-10-26 LAB — CBC
Hematocrit: 34.5 % (ref 34.0–46.6)
Hemoglobin: 11 g/dL — ABNORMAL LOW (ref 11.1–15.9)
MCH: 29.8 pg (ref 26.6–33.0)
MCHC: 31.9 g/dL (ref 31.5–35.7)
MCV: 94 fL (ref 79–97)
Platelets: 230 10*3/uL (ref 150–450)
RBC: 3.69 x10E6/uL — ABNORMAL LOW (ref 3.77–5.28)
RDW: 12.5 % (ref 11.7–15.4)
WBC: 6.5 10*3/uL (ref 3.4–10.8)

## 2019-10-26 LAB — LIPID PANEL
Chol/HDL Ratio: 2.2 ratio (ref 0.0–4.4)
Cholesterol, Total: 112 mg/dL (ref 100–199)
HDL: 52 mg/dL (ref >39)
LDL Chol Calc (NIH): 42 mg/dL (ref 0–99)
Triglycerides: 95 mg/dL (ref 0–149)
VLDL Cholesterol Cal: 18 mg/dL (ref 5–40)

## 2019-10-26 LAB — BASIC METABOLIC PANEL
BUN/Creatinine Ratio: 24 (ref 12–28)
BUN: 24 mg/dL (ref 10–36)
CO2: 21 mmol/L (ref 20–29)
Calcium: 9.9 mg/dL (ref 8.7–10.3)
Chloride: 105 mmol/L (ref 96–106)
Creatinine, Ser: 1.02 mg/dL — ABNORMAL HIGH (ref 0.57–1.00)
GFR calc Af Amer: 55 mL/min/{1.73_m2} — ABNORMAL LOW (ref >59)
GFR calc non Af Amer: 48 mL/min/{1.73_m2} — ABNORMAL LOW (ref >59)
Glucose: 98 mg/dL (ref 65–99)
Potassium: 4.3 mmol/L (ref 3.5–5.2)
Sodium: 141 mmol/L (ref 134–144)

## 2019-10-26 LAB — HEPATIC FUNCTION PANEL
ALT: 12 IU/L (ref 0–32)
AST: 25 IU/L (ref 0–40)
Albumin: 4 g/dL (ref 3.5–4.6)
Alkaline Phosphatase: 98 IU/L (ref 39–117)
Bilirubin Total: 0.4 mg/dL (ref 0.0–1.2)
Bilirubin, Direct: 0.17 mg/dL (ref 0.00–0.40)
Total Protein: 6.6 g/dL (ref 6.0–8.5)

## 2019-12-07 ENCOUNTER — Other Ambulatory Visit: Payer: Self-pay | Admitting: Nurse Practitioner

## 2021-12-29 LAB — LAB REPORT - SCANNED: EGFR: 40

## 2022-10-01 ENCOUNTER — Encounter: Payer: Self-pay | Admitting: Physician Assistant

## 2022-10-01 ENCOUNTER — Ambulatory Visit: Payer: Medicare HMO | Attending: Physician Assistant | Admitting: Physician Assistant

## 2022-10-01 VITALS — BP 110/70 | HR 77 | Wt 98.8 lb

## 2022-10-01 DIAGNOSIS — I720 Aneurysm of carotid artery: Secondary | ICD-10-CM

## 2022-10-01 DIAGNOSIS — E785 Hyperlipidemia, unspecified: Secondary | ICD-10-CM

## 2022-10-01 DIAGNOSIS — R55 Syncope and collapse: Secondary | ICD-10-CM

## 2022-10-01 DIAGNOSIS — I2510 Atherosclerotic heart disease of native coronary artery without angina pectoris: Secondary | ICD-10-CM

## 2022-10-01 DIAGNOSIS — Z951 Presence of aortocoronary bypass graft: Secondary | ICD-10-CM | POA: Diagnosis not present

## 2022-10-01 DIAGNOSIS — R54 Age-related physical debility: Secondary | ICD-10-CM

## 2022-10-01 DIAGNOSIS — I10 Essential (primary) hypertension: Secondary | ICD-10-CM | POA: Diagnosis not present

## 2022-10-01 MED ORDER — APIXABAN 2.5 MG PO TABS: 2.5000 mg | ORAL_TABLET | Freq: Two times a day (BID) | ORAL | 3 refills | Status: AC

## 2022-10-01 MED ORDER — APIXABAN 2.5 MG PO TABS: 2.5000 mg | ORAL_TABLET | Freq: Two times a day (BID) | ORAL | 11 refills | Status: DC

## 2022-10-01 NOTE — Patient Instructions (Addendum)
Medication Instructions:  Your physician has recommended you make the following change in your medication:  1.Stop aspirin 325 mg 2.Start Eliquis 2.5 mg twice a day  *If you need a refill on your cardiac medications before your next appointment, please call your pharmacy*  Lab Work: Have your primary care provider fax [place] a copy of your lab results [phone] If you have labs (blood work) drawn today and your tests are completely normal, you will receive your results only by: [place] (if you have MyChart) OR A paper copy in the mail If you have any lab test that is abnormal or we need to change your treatment, we will call you to review the results.  Follow-Up: At [facility], you and your health needs are our priority.  As part of our continuing mission to provide you with exceptional heart care, we have created designated Provider Care Teams.  These Care Teams include your primary Cardiologist (physician) and Advanced Practice Providers (APPs -  Physician Assistants and Nurse Practitioners) who all work together to provide you with the care you need, when you need it.  Your next appointment:   1 year(s)  Provider:   Peter Martinique, MD

## 2022-10-01 NOTE — Progress Notes (Signed)
Office Visit    Patient Name: Laura Woodard Date of Encounter: 10/01/2022  PCP:  Adaline Sill, NP   [place]  Cardiologist:  Peter Martinique, MD  Advanced Practice Provider:  No care team member to display Electrophysiologist:  None   HPI    Laura Woodard is a 89 y.o. female with a past medical history of known CAD with remote CABG in 2009, HTN, and HLD presents today for overdue follow-up visit.  Upon review of her chart, she was seen back in May 2014 after going to [facility] for syncope in April 2014.  Records were reviewed and it was felt that she had a vasovagal episode and was probably volume depleted at the time and over heated when sitting under hair dryer.  Found to have UTI.  Did not get a CT of her head nor carotid Dopplers but did have a CTA of the chest due to elevated D-dimer that was negative.  Potassium was 3.2.  HCTZ was cut out of her lisinopril and she was placed on plain lisinopril at that time.  Also, she stopped Zoloft and did not wish to resume.  She had been given some Ambien as well to help with her sleep.  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  She saw Dr. Estanislado Pandy and did not wish to have further evaluation in light of her advancing age.  She has had pulmonary nodules noted.  Has remained on full dose aspirin per her choice.  She was seen in the ER January 2018 with chest pain and cardiology was consulted and advised increasing Norvasc and follow-up.  Follow-up was not done.  She has done okay since then had slowed down somewhat but was still mentally sharp.  She did have a telemetry health visit a year prior to her last appointment (2020) due to the pandemic.  She was last seen back in 2021 and was doing okay at that time.  She noted that her memory was starting to get a little "off".  Has been taking over-the-counter medicines but  apparently was missing some/doubling others.  They arranged for a new primary care.  No recent labs.  No chest pain and no shortness of breath.  Was still living alone at that time and was doing her own housework.  No episodes of syncope.  Overall, no real concerns.  No interest in the COVID-vaccine at that time.  Today, she states that her only symptom has been occasional leg and feet swelling.  Her primary gave her as needed Lasix 20 mg daily to use.  Her son states that she will occasionally need it 7 days straight and then discontinue it after that as swelling resolves.  I have encouraged her to elevate her legs and use lower extremity compression as needed.  Her short-term memory continues to be an issue but long-term memory is very good.  She does frequent crossword puzzles to keep her mind sharp.  She is still living at home and takes care of her own household.  Her son lives next-door and checks in on her frequently.  With new onset of atrial fibrillation which was found today on EKG we discussed several options including her CHA2DS2-VASc score and her risk of stroke especially with previous stroke.  Ultimately, we decided to start Eliquis low-dose due to her weight, age, and creatinine and discontinue full dose aspirin.  Patient and son are in agreement that this is the best course of action.  Reports no shortness of breath nor dyspnea on exertion. Reports no chest pain, pressure, or tightness. No edema, orthopnea, PND. Reports no palpitations.   Past Medical History    Past Medical History:  Diagnosis Date   Arthritis    Coronary artery disease    s/p CABG in November 2009   GERD (gastroesophageal reflux disease)    Headache(784.0)    HOH (hard of hearing)    Hyperlipidemia    Hypertension    Past Surgical History:  Procedure Laterality Date   ABDOMINAL HYSTERECTOMY     CARDIAC CATHETERIZATION  05/16/2008   EF 70+   CARDIOVASCULAR STRESS TEST  05/11/2008   EF 61%.ABNORMAL STRESS  NUCLEAR STUDY. FINDINGS ARE CONSISTENT WITH ANTERIOR ISCHEMIA   CATARACT EXTRACTION W/PHACO Right 12/13/2013   Procedure: CATARACT EXTRACTION PHACO AND INTRAOCULAR LENS PLACEMENT ([place]);  Surgeon: Tonny Branch, MD;  Location: [facility];  Service: Ophthalmology;  Laterality: Right;  CDE 7.89   CATARACT EXTRACTION W/PHACO Left 01/06/2014   Procedure: CATARACT EXTRACTION PHACO AND INTRAOCULAR LENS PLACEMENT ([facility]);  Surgeon: Tonny Branch, MD;  Location: [facility];  Service: Ophthalmology;  Laterality: Left;  CDE:9.23   CHOLECYSTECTOMY     CORONARY ARTERY BYPASS GRAFT  November 2009   LIMA to LAD, SVG to DX, SVG to 1st OM and SVG to RCA   REPLACEMENT TOTAL KNEE BILATERAL     US ECHOCARDIOGRAPHY  05/12/2008   EF 55-60%    Allergies  Allergies  Allergen Reactions   Sulfa Antibiotics Hives and Itching   Sulfamethoxazole-Trimethoprim Nausea Only    EKGs/Labs/Other Studies Reviewed:   The following studies were reviewed today: No recent studies  EKG:  EKG is  ordered today.  The ekg ordered today demonstrates rate controlled Afib, rate 77 bpm  Recent Labs: No results found for requested labs within last 365 days.  Recent Lipid Panel    Component Value Date/Time   CHOL 112 10/25/2019 1214   TRIG 95 10/25/2019 1214   HDL 52 10/25/2019 1214   CHOLHDL 2.2 10/25/2019 1214   CHOLHDL 2.6 06/14/2014 0612   VLDL 29 06/14/2014 0612   LDLCALC 42 10/25/2019 1214    Risk Assessment/Calculations:   CHA2DS2-VASc Score = 7   This indicates a 11.2% annual risk of stroke. The patient's score is based upon: CHF History: 0 HTN History: 1 Diabetes History: 0 Stroke History: 2 Vascular Disease History: 1 Age Score: 2 Gender Score: 1     Home Medications   Current Meds  Medication Sig   acetaminophen (TYLENOL) 500 MG tablet Take 500 mg by mouth as needed for mild pain or headache.    aspirin EC 325 MG tablet Take 1 tablet (325 mg total) by mouth daily.   atorvastatin (LIPITOR) 20 MG tablet Take 20 mg  by mouth at bedtime.    B Complex Vitamins (VITAMIN B COMPLEX 100) INJ Inject 1 Units as directed every 30 (thirty) days.   CRANBERRY PO Take 1 capsule by mouth 2 (two) times daily.   Cyanocobalamin (B-12 COMPLIANCE INJECTION IJ) Inject 1 each as directed every 30 (thirty) days.   ENSURE (ENSURE) Take 237 mLs by mouth as needed.   furosemide (LASIX) 20 MG tablet Take 20 mg by mouth daily as needed.   levothyroxine (SYNTHROID, LEVOTHROID) 50 MCG tablet Take 50 mcg by mouth daily before breakfast.   lisinopril (PRINIVIL,ZESTRIL) 20 MG tablet TAKE ONE TABLET BY MOUTH ONCE  DAILY   metoprolol succinate (TOPROL-XL) 50 MG 24 hr tablet Take 1 tablet (50 mg total) by mouth daily.   mirtazapine (REMERON) 15 MG tablet Take 15 mg by mouth at bedtime.   Multiple Vitamin (MULTIVITAMIN WITH MINERALS) TABS tablet Take 1 tablet by mouth daily.   nitroGLYCERIN (NITROSTAT) 0.4 MG SL tablet Place 1 tablet (0.4 mg total) under the tongue every 5 (five) minutes as needed for chest pain.   pantoprazole (PROTONIX) 40 MG tablet Take 40 mg by mouth daily.   Simethicone (GAS-X PO) Take 1 tablet by mouth daily as needed. Indigestion      Review of Systems      All other systems reviewed and are otherwise negative except as noted above.  Physical Exam    VS:  BP 110/70   Pulse 77   Wt 98 lb 12.8 oz (44.8 kg)   SpO2 99%   BMI 18.07 kg/m  , BMI Body mass index is 18.07 kg/m.  Wt Readings from Last 3 Encounters:  10/01/22 98 lb 12.8 oz (44.8 kg)  10/25/19 113 lb 3.2 oz (51.3 kg)  10/19/18 112 lb 3.2 oz (50.9 kg)     GEN: Well nourished, well developed, in no acute distress. HEENT: normal. Neck: Supple, no JVD, carotid bruits, or masses. Cardiac: irregularly irregular, no murmurs, rubs, or gallops. No clubbing, cyanosis, edema.  Radials/PT 2+ and equal bilaterally.  Respiratory:  Respirations regular and unlabored, clear to auscultation bilaterally. GI: Soft, nontender, nondistended. MS: No deformity or  atrophy. Skin: Warm and dry, no rash. Neuro:  Strength and sensation are intact. Psych: Normal affect.  Assessment & Plan    New onset Afib -She is rate controlled today, 77 bpm -Will plan to start Eliquis 2.5 mg twice daily and discontinue her full dose aspirin -With previous history of CAD status post CABG and previous stroke in 2015 her CHA2DS2-VASc score is 7 with an 11.2% risk of annual stroke -She has not had any recent falls and has a walker at home that she uses as well as a cane -After discussion with Dr. Johnsie Cancel and and discussion with her family we have decided to start her on Eliquis today  CAD status post remote CABG from 2009 -She has been doing well with no issues with chest pain or SOB -continue current medications including amlodipine 10 mg daily, Lipitor 20 mg daily, lisinopril 20 mg daily, metoprolol succinate 50 mg daily, nitro as needed, furosemide 20 mg as needed  Prior stroke from 2015 -no residual issues -stop full dose ASA since she is starting Eliquis -working on [employer], short term is an option but long term is preserved  HTN -Blood pressure is well-controlled today -Continue to monitor at home -Will plan to continue current medications for now  HLD -We will need most recent lab work sent from her PCP -Continue Lipitor 20 mg daily for now  Prior finding of incidental aneurysm of the carotid -further testing/follow-up was declined, not discussed today  Remote syncope -no recent issues  Advanced age -Doing well and living independently -her son does live next door and keeps a close eye on her      Disposition: Follow up 1 year with Peter Martinique, MD or APP.  Signed, Elgie Collard, PA-C 10/01/2022, 4:30 PM [facility]

## 2023-10-06 ENCOUNTER — Other Ambulatory Visit: Payer: Self-pay | Admitting: Physician Assistant

## 2023-10-06 NOTE — Telephone Encounter (Signed)
 Prescription refill request for Eliquis received. Indication: AF Last office visit: 10/01/22  T Conte PA-C Scr: 1.23 on 12/29/21  Epic Age: 89 Weight: 44.8kg  Based on above findings Eliquis 2.5mg  twice daily is the appropriate dose.  Pt is past due for lab work.  Has an upcome appt with Dr Oliver Pila on 11/19/23.  Requested labs be done at that time. Refill approved.

## 2023-11-15 NOTE — Progress Notes (Unsigned)
 Office Visit    Patient Name: Laura Woodard Date of Encounter: 11/19/2023  PCP:  Lenn Quint, NP   [facility]  Cardiologist:  Portia Wisdom [place], MD  Advanced Practice Provider:  No care team member to display Electrophysiologist:  None   HPI    Laura Woodard is a 89 y.o. 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  She saw Dr. Alvira Josephs and did not wish to have further evaluation in light of her advancing age.  She has had pulmonary nodules noted.  Has remained on full dose aspirin  per her choice.  She was seen in the ER January 2018 with chest pain and cardiology was consulted and advised increasing Norvasc  and follow-up.    Last seen in our office in March 2024 and was noted to be in Afib. Rate controlled. She was placed on low dose Elquis due to age.  She has noted  some intermittent swelling and takes  lasix PRN. No dyspnea, no dizziness or palpitations. No chest pain. Overall doing ok. Only mild bruising on Eliquis   Past Medical History    Past Medical History:  Diagnosis Date   Arthritis    Coronary artery disease    s/p CABG in November 2009   GERD (gastroesophageal reflux disease)    Headache(784.0)    HOH (hard of hearing)    Hyperlipidemia    Hypertension    Past Surgical History:  Procedure Laterality Date   ABDOMINAL HYSTERECTOMY     CARDIAC CATHETERIZATION  05/16/2008   EF 70+   CARDIOVASCULAR STRESS TEST  05/11/2008   EF 61%.ABNORMAL STRESS NUCLEAR STUDY. FINDINGS ARE CONSISTENT WITH ANTERIOR ISCHEMIA   CATARACT EXTRACTION W/PHACO Right 12/13/2013   Procedure: CATARACT EXTRACTION PHACO AND INTRAOCULAR LENS PLACEMENT ([facility]);  Surgeon: Anner Kill, MD;  Location: [facility];  Service: Ophthalmology;  Laterality: Right;  CDE 7.89   CATARACT EXTRACTION W/PHACO  Left 01/06/2014   Procedure: CATARACT EXTRACTION PHACO AND INTRAOCULAR LENS PLACEMENT ([facility]);  Surgeon: Anner Kill, MD;  Location: [facility];  Service: Ophthalmology;  Laterality: Left;  CDE:9.23   CHOLECYSTECTOMY     CORONARY ARTERY BYPASS GRAFT  November 2009   LIMA to LAD, SVG to DX, SVG to 1st OM and SVG to RCA   REPLACEMENT TOTAL KNEE BILATERAL     US  ECHOCARDIOGRAPHY  05/12/2008   EF 55-60%    Allergies  Allergies  Allergen Reactions   Sulfa Antibiotics Hives and Itching   Sulfamethoxazole-Trimethoprim Nausea Only    EKGs/Labs/Other Studies Reviewed:   The following studies were reviewed today: No recent studies  EKG Interpretation Date/Time:  Wednesday Nov 19 2023 14:20:46 EDT Ventricular Rate:  70 PR Interval:    QRS Duration:  82 QT Interval:  376 QTC Calculation: 406 R Axis:   65  Text Interpretation: Atrial fibrillation with premature ventricular or aberrantly conducted complexes Septal infarct , age undetermined When compared with ECG of October 01, 2022 No significant change was found Confirmed by [place], Harrietta Incorvaia [id number]) on 11/19/2023 2:47:41 PM    Recent Labs: No results found for requested labs within last 365 days.  Recent Lipid Panel    Component Value Date/Time   CHOL 112 10/25/2019 1214   TRIG 95 10/25/2019 1214   HDL 52 10/25/2019 1214   CHOLHDL  2.2 10/25/2019 1214   CHOLHDL 2.6 06/14/2014 0612   VLDL 29 06/14/2014 0612   LDLCALC 42 10/25/2019 1214    Risk Assessment/Calculations:   CHA2DS2-VASc Score =     This indicates a  % annual risk of stroke. The patient's score is based upon:       Home Medications   Current Meds  Medication Sig   acetaminophen  (TYLENOL ) 500 MG tablet Take 500 mg by mouth as needed for mild pain or headache.    amLODipine  (NORVASC ) 10 MG tablet Take 10 mg by mouth daily.    apixaban  (ELIQUIS ) 2.5 MG TABS tablet Take 1 tablet (2.5 mg total) by mouth 2 (two) times daily. LOT [id number] EXP APR 2025   apixaban  (ELIQUIS ) 2.5  MG TABS tablet Take 1 tablet by mouth twice daily   atorvastatin  (LIPITOR) 20 MG tablet Take 20 mg by mouth at bedtime.    B Complex Vitamins (VITAMIN B COMPLEX 100) INJ Inject 1 Units as directed every 30 (thirty) days.   CRANBERRY PO Take 1 capsule by mouth 2 (two) times daily.   Cyanocobalamin  (B-12 COMPLIANCE INJECTION IJ) Inject 1 each as directed every 30 (thirty) days.   ENSURE (ENSURE) Take 237 mLs by mouth as needed.   furosemide (LASIX) 20 MG tablet Take 20 mg by mouth daily as needed.   levothyroxine (SYNTHROID, LEVOTHROID) 50 MCG tablet Take 50 mcg by mouth daily before breakfast.   lisinopril  (PRINIVIL ,ZESTRIL ) 20 MG tablet TAKE ONE TABLET BY MOUTH ONCE DAILY   metoprolol  succinate (TOPROL -XL) 50 MG 24 hr tablet Take 1 tablet (50 mg total) by mouth daily.   mirtazapine  (REMERON ) 15 MG tablet Take 15 mg by mouth at bedtime.   Multiple Vitamin (MULTIVITAMIN WITH MINERALS) TABS tablet Take 1 tablet by mouth daily.   nitroGLYCERIN  (NITROSTAT ) 0.4 MG SL tablet Place 1 tablet (0.4 mg total) under the tongue every 5 (five) minutes as needed for chest pain.   pantoprazole  (PROTONIX ) 40 MG tablet Take 40 mg by mouth daily.   Simethicone  (GAS-X PO) Take 1 tablet by mouth daily as needed. Indigestion      Review of Systems      All other systems reviewed and are otherwise negative except as noted above.  Physical Exam    VS:  BP 127/71 (Cuff Size: Small)   Pulse 69   Ht 5\' 2"  (1.575 m)   Wt 99 lb 6.4 oz (45.1 kg)   SpO2 96%   BMI 18.18 kg/m  , BMI Body mass index is 18.18 kg/m.  Wt Readings from Last 3 Encounters:  11/19/23 99 lb 6.4 oz (45.1 kg)  10/01/22 98 lb 12.8 oz (44.8 kg)  10/25/19 113 lb 3.2 oz (51.3 kg)     GEN: Well nourished, well developed, in no acute distress. HEENT: normal. Neck: Supple, no JVD, carotid bruits, or masses. Cardiac: irregularly irregular, harsh gr 3/6 systolic murmur LSB, no rubs, or gallops. No clubbing, cyanosis, edema.  Radials/PT 2+ and  equal bilaterally.  Respiratory:  Respirations regular and unlabored, clear to auscultation bilaterally. GI: Soft, nontender, nondistended. MS: No deformity or atrophy. Skin: Warm and dry, no rash. Neuro:  Strength and sensation are intact. Psych: Normal affect.  Assessment & Plan     Afib persistent -She is rate controlled today -With previous history of CAD status post CABG and previous stroke in 2015 her CHA2DS2-VASc score is 7 with an 11.2% risk of annual stroke -continue low dose Eliquis  due to age - reports labs  done last week with PCP and OK.  - no significant symptoms.   CAD status post remote CABG from 2009 -She has been doing well no angina -continue current medications including amlodipine  10 mg daily, Lipitor 20 mg daily, lisinopril  20 mg daily, metoprolol  succinate 50 mg daily, nitro as needed, furosemide 20 mg as needed  Prior stroke from 2015 -no residual issues - on Eliquis    HTN -Blood pressure is well-controlled today -Will plan to continue current medications for now  HLD -labs per PCP -Continue Lipitor 20 mg daily for now  Prior finding of incidental aneurysm of the carotid -further testing/follow-up was declined, not discussed today  Remote syncope -no recent issues  Advanced age -Doing well and living independently -her son does live next door and keeps a close eye on her  8.  Murmur. Suspect aortic stenosis. Do not plan Echo since she is really not a candidate for intervention      Disposition: Follow up 6 months with Estell Puccini [place], MD or APP.  Signed, Nabil Bubolz [place], MD 11/19/2023, 2:54 PM [facility]

## 2023-11-19 ENCOUNTER — Ambulatory Visit: Payer: Medicare HMO | Attending: Cardiology | Admitting: Cardiology

## 2023-11-19 ENCOUNTER — Encounter: Payer: Self-pay | Admitting: Cardiology

## 2023-11-19 VITALS — BP 127/71 | HR 69 | Ht 62.0 in | Wt 99.4 lb

## 2023-11-19 DIAGNOSIS — I10 Essential (primary) hypertension: Secondary | ICD-10-CM | POA: Diagnosis not present

## 2023-11-19 DIAGNOSIS — Z951 Presence of aortocoronary bypass graft: Secondary | ICD-10-CM

## 2023-11-19 DIAGNOSIS — I2510 Atherosclerotic heart disease of native coronary artery without angina pectoris: Secondary | ICD-10-CM

## 2023-11-19 DIAGNOSIS — I4819 Other persistent atrial fibrillation: Secondary | ICD-10-CM | POA: Diagnosis not present

## 2023-11-19 NOTE — Patient Instructions (Signed)

## 2024-01-10 ENCOUNTER — Other Ambulatory Visit: Payer: Self-pay | Admitting: Cardiology

## 2024-01-12 NOTE — Telephone Encounter (Addendum)
 Prescription refill request for Eliquis  received. Indication: AF Last office visit: 11/19/23  P [place] MD Scr: 1.23 on 12/29/21  Epic Age: 89 Weight: 45.1kg  Based on above findings Eliquis  2.5mg  twice daily is the appropriate dose.  Requested labs at upcoming appt with MD.  Refill approved.

## 2024-07-05 ENCOUNTER — Other Ambulatory Visit: Payer: Self-pay | Admitting: Cardiology

## 2024-07-05 DIAGNOSIS — I4819 Other persistent atrial fibrillation: Secondary | ICD-10-CM

## 2024-07-05 NOTE — Telephone Encounter (Signed)
 Prescription refill request for Eliquis  received. Indication:afib Last office visit:5/25 Scr: needs labs Age:89 Weight:45.1  kg  Prescription refilled

## 2024-07-24 ENCOUNTER — Other Ambulatory Visit: Payer: Self-pay | Admitting: Cardiology

## 2024-07-24 DIAGNOSIS — I4819 Other persistent atrial fibrillation: Secondary | ICD-10-CM

## 2024-07-27 NOTE — Telephone Encounter (Signed)
 Eliquis  2.5mg  refill request received. Patient is 89 years old, weight-45.1kg, Crea-1.59 on 05/06/24 via [employer] Tab from [employer], Diagnosis-Afib, and last seen by Dr. Jordan on 11/19/23. Dose is appropriate based on dosing criteria. Will send in refill to requested pharmacy.    30 day supply sent 07/05/24 with a note for labs needed but labs are in [employer] tab,
